# Patient Record
Sex: Female | Born: 1950 | Race: White | Hispanic: No | Marital: Married | Smoking: Never smoker
Health system: Southern US, Community
[De-identification: ages and names within clinical notes are randomized; demographics above are authoritative.]

## PROBLEM LIST (undated history)

## (undated) DIAGNOSIS — R112 Nausea with vomiting, unspecified: Secondary | ICD-10-CM

## (undated) DIAGNOSIS — M79645 Pain in left finger(s): Secondary | ICD-10-CM

## (undated) DIAGNOSIS — Z9889 Other specified postprocedural states: Secondary | ICD-10-CM

## (undated) DIAGNOSIS — T4145XA Adverse effect of unspecified anesthetic, initial encounter: Secondary | ICD-10-CM

## (undated) DIAGNOSIS — M810 Age-related osteoporosis without current pathological fracture: Secondary | ICD-10-CM

## (undated) DIAGNOSIS — T8859XA Other complications of anesthesia, initial encounter: Secondary | ICD-10-CM

## (undated) DIAGNOSIS — M1990 Unspecified osteoarthritis, unspecified site: Secondary | ICD-10-CM

## (undated) HISTORY — PX: ABDOMINAL HYSTERECTOMY: SHX81

## (undated) HISTORY — PX: OOPHORECTOMY: SHX86

---

## 2015-10-18 ENCOUNTER — Emergency Department (HOSPITAL_BASED_OUTPATIENT_CLINIC_OR_DEPARTMENT_OTHER)
Admission: EM | Admit: 2015-10-18 | Discharge: 2015-10-18 | Disposition: A | Discharge status: Home / Self Care | Payer: Medicare Other | Attending: Emergency Medicine | Admitting: Emergency Medicine

## 2015-10-18 ENCOUNTER — Emergency Department (HOSPITAL_BASED_OUTPATIENT_CLINIC_OR_DEPARTMENT_OTHER): Payer: Medicare Other

## 2015-10-18 ENCOUNTER — Encounter (HOSPITAL_BASED_OUTPATIENT_CLINIC_OR_DEPARTMENT_OTHER): Payer: Self-pay | Admitting: *Deleted

## 2015-10-18 DIAGNOSIS — Y999 Unspecified external cause status: Secondary | ICD-10-CM | POA: Insufficient documentation

## 2015-10-18 DIAGNOSIS — Y939 Activity, unspecified: Secondary | ICD-10-CM | POA: Insufficient documentation

## 2015-10-18 DIAGNOSIS — Z79899 Other long term (current) drug therapy: Secondary | ICD-10-CM | POA: Insufficient documentation

## 2015-10-18 DIAGNOSIS — S52592A Other fractures of lower end of left radius, initial encounter for closed fracture: Secondary | ICD-10-CM | POA: Insufficient documentation

## 2015-10-18 DIAGNOSIS — W07XXXA Fall from chair, initial encounter: Secondary | ICD-10-CM | POA: Insufficient documentation

## 2015-10-18 DIAGNOSIS — Y929 Unspecified place or not applicable: Secondary | ICD-10-CM | POA: Insufficient documentation

## 2015-10-18 DIAGNOSIS — S5292XA Unspecified fracture of left forearm, initial encounter for closed fracture: Secondary | ICD-10-CM

## 2015-10-18 HISTORY — DX: Age-related osteoporosis without current pathological fracture: M81.0

## 2015-10-18 NOTE — ED Notes (Signed)
Patient transported to X-ray 

## 2015-10-18 NOTE — ED Provider Notes (Signed)
[facility] Provider Note   CSN: [id number] Arrival date & time: 10/18/15  1511     History   Chief Complaint Chief Complaint  Patient presents with  . Fall    HPI Carolyn [name] is a 65 y.o. female.  HPI  65 year old female with a history of osteoporosis who fell from a chair and injured her left wrist. This occurred just prior to arrival. Patient states that she was yanking on something and due to momentum fell off the back of the chair. She landed on her wrist but is not exactly sure how. She has had pain and swelling in the wrist and at the base of her hand. No weakness or numbness. She thinks she hit her head on another chair on the way down but did not lose consciousness. Denies a headache currently. No nausea or vomiting or blurry vision. No other injuries.Is not on a blood thinner.  Past Medical History:  Diagnosis Date  . Osteoporosis     There are no active problems to display for this patient.   Past Surgical History:  Procedure Laterality Date  . ABDOMINAL HYSTERECTOMY    . CESAREAN SECTION    . OOPHORECTOMY      OB History    No data available       Home Medications    Prior to Admission medications   Medication Sig Start Date End Date Taking? Authorizing Provider  Calcium Carb-Cholecalciferol (CALCIUM 1000 + D PO) Take by mouth.   Yes Historical Provider, MD  estrogens, conjugated, (PREMARIN) 0.3 MG tablet Take 0.3 mg by mouth daily. Take daily for 21 days then do not take for 7 days.   Yes Historical Provider, MD  ketotifen (ZADITOR) 0.025 % ophthalmic solution 1 drop 2 (two) times daily.   Yes Historical Provider, MD    Family History History reviewed. No pertinent family history.  Social History Social History  Substance Use Topics  . Smoking status: Never Smoker  . Smokeless tobacco: Never Used  . Alcohol use 6.0 oz/week    10 Glasses of wine per week     Allergies   Sulfa antibiotics   Review of Systems Review of  Systems  Eyes: Negative for visual disturbance.  Gastrointestinal: Negative for nausea and vomiting.  Musculoskeletal: Positive for arthralgias and joint swelling.  Neurological: Negative for weakness, numbness and headaches.  All other systems reviewed and are negative.    Physical Exam Updated Vital Signs BP 128/74 (BP Location: Left Arm)   Pulse 88   Temp 98.6 F (37 C) (Oral)   Resp 18   Ht 5' 4.5" (1.638 m)   Wt 130 lb (59 kg)   SpO2 98%   BMI 21.97 kg/m   Physical Exam  Constitutional: She is oriented to person, place, and time. She appears well-developed and well-nourished. No distress.  HENT:  Head: Normocephalic and atraumatic.  Right Ear: External ear normal.  Left Ear: External ear normal.  Nose: Nose normal.  No swelling or tenderness to scalp  Eyes: EOM are normal. Pupils are equal, round, and reactive to light. Right eye exhibits no discharge. Left eye exhibits no discharge.  Neck: Normal range of motion. Neck supple.  No neck tenderness  Cardiovascular: Normal rate, regular rhythm and normal heart sounds.   Pulses:      Radial pulses are 2+ on the left side.  Pulmonary/Chest: Effort normal and breath sounds normal.  Abdominal: She exhibits no distension.  Musculoskeletal: She exhibits no deformity.  Left elbow: She exhibits normal range of motion. No tenderness found.       Left wrist: She exhibits decreased range of motion, tenderness and swelling (mild).       Left forearm: She exhibits no tenderness.       Left hand: She exhibits tenderness (over left scaphoid). She exhibits normal capillary refill and no deformity. Normal sensation noted.  Neurological: She is alert and oriented to person, place, and time.  Skin: Skin is warm and dry. She is not diaphoretic.  Nursing note and vitals reviewed.    ED Treatments / Results  Labs (all labs ordered are listed, but only abnormal results are displayed) Labs Reviewed - No data to display  EKG  EKG  Interpretation None       Radiology Dg Wrist Complete Left  Result Date: 10/18/2015 CLINICAL DATA:  [place] with left wrist pain EXAM: LEFT WRIST - COMPLETE 3+ VIEW COMPARISON:  None. FINDINGS: There does appear to be a subtle linear lucency within the distal left radius extending to the radiocarpal joint space consistent with nondisplaced fracture. This linear lucency is only seen on one view. The distal left ulna is intact. Carpal bones are in normal position. IMPRESSION: There does appear to be a subtle linear fracture of the distal left radius involving the radiocarpal joint space on one view. Electronically Signed   By: Ivar Drape M.D.   On: 10/18/2015 15:44    Procedures Procedures (including critical care time)  Medications Ordered in ED Medications - No data to display   Initial Impression / Assessment and Plan / ED Course  I have reviewed the triage vital signs and the nursing notes.  Pertinent labs & imaging results that were available during my care of the patient were reviewed by me and considered in my medical decision making (see chart for details).  Clinical Course    Nondisplaced fracture of left radius as above. Placed in a thumb spica given the significant scaphoid tenderness. Follow-up with with orthopedics. She does not want anything stronger than tylenol for pain. No headache or concern for serious intracranial injury. Discussed return precautions.  Final Clinical Impressions(s) / ED Diagnoses   Final diagnoses:  Radius fracture, left, closed, initial encounter    New Prescriptions New Prescriptions   No medications on file     Sherwood Gambler, MD 10/18/15 [phone]

## 2015-10-18 NOTE — ED Notes (Signed)
MD at bedside. 

## 2015-10-18 NOTE — ED Triage Notes (Addendum)
Fall from standing in a chair on a patio.  Reports left wrist pain.  Denies LOC but did hit head.  Slight swelling to left wrist. CMS intact.

## 2016-01-02 ENCOUNTER — Ambulatory Visit: Payer: Medicare Other | Attending: Orthopedic Surgery | Admitting: Physical Therapy

## 2016-01-02 DIAGNOSIS — R293 Abnormal posture: Secondary | ICD-10-CM | POA: Insufficient documentation

## 2016-01-02 DIAGNOSIS — M25512 Pain in left shoulder: Secondary | ICD-10-CM | POA: Diagnosis present

## 2016-01-02 DIAGNOSIS — M25612 Stiffness of left shoulder, not elsewhere classified: Secondary | ICD-10-CM

## 2016-01-02 NOTE — Therapy (Addendum)
[facility] [address]  [name], [place], [address] Phone: [phone]   Fax:  [phone]  Physical Therapy Evaluation  Patient Details  Name: [name] MRN: [id number] Date of Birth: [birth year] Referring Provider: Jolyn Nap, MD  Encounter Date: 01/02/2016      PT End of Session - 01/02/16 0853    Visit Number 1   Number of Visits 8   Date for PT Re-Evaluation 01/30/16   PT Start Time 0840   PT Stop Time 0934   PT Time Calculation (min) 54 min   Activity Tolerance Patient tolerated treatment well   Behavior During Therapy [facility] for tasks assessed/performed      Past Medical History:  Diagnosis Date  . Osteoporosis     Past Surgical History:  Procedure Laterality Date  . ABDOMINAL HYSTERECTOMY    . CESAREAN SECTION    . OOPHORECTOMY      There were no vitals filed for this visit.       Subjective Assessment - 01/02/16 0843    Subjective Pt fell on 10/09/15 resulting in L radial wrist fracture. Feels like wrist is recovering well other than tendon rupture which will require surgery. Pt reports more issues with shoulder at present, including constant nagging type pain with limited functional ROM esp in IR. Reports pain will wake her up from sleeping.   Patient Stated Goals "To get my shoulder back to normal."   Currently in Pain? No/denies   Pain Score 0-No pain  Least 0/10, Up to 4/10 at worst   Pain Location Shoulder   Pain Orientation Left;Lateral;Posterior;Upper   Pain Descriptors / Indicators Aching   Pain Type Acute pain   Pain Radiating Towards down posterior/lateral upper arm to elbow   Pain Onset More than a month ago  at time of wrist fracture   Pain Frequency Intermittent   Aggravating Factors  laying on her arm, reaching behind her back, lifting, raising arm overhead   Pain Relieving Factors rest, OTC pain meds   Effect of Pain on Daily Activities requires her to move  more slowly, unable to sleep on L side (preferred sleeping position)            [facility] PT Assessment - 01/02/16 0845      Assessment   Medical Diagnosis L shoulder pain, L wrist distal radius fracture   Referring Provider Jolyn Nap, MD   Onset Date/Surgical Date 10/09/15   Hand Dominance Right   Next MD Visit week of 02/07/16 - surgery for tendon repair   Prior Therapy none     Balance Screen   Has the patient fallen in the past 6 months Yes   How many times? 1  fell off chair   Has the patient had a decrease in activity level because of a fear of falling?  No   Is the patient reluctant to leave their home because of a fear of falling?  No     Prior Function   Level of Independence Independent   Vocation Retired   Surveyor, minerals, gym, walk, yoga     Observation/Other Assessments   Focus on Therapeutic Outcomes (FOTO)  Wrist 55% (45% limitation); predicted 70% (30% limitation)     Posture/Postural Control   Posture/Postural Control Postural limitations   Postural Limitations Forward head;Rounded Shoulders;Decreased thoracic kyphosis     ROM / Strength   AROM / PROM / Strength AROM;Strength     AROM  AROM Assessment Site Shoulder;Elbow;Forearm;Wrist   Right/Left Shoulder Right;Left   Right Shoulder Flexion 157 Degrees   Right Shoulder ABduction 180 Degrees   Right Shoulder Internal Rotation 100 Degrees   Right Shoulder External Rotation 96 Degrees   Left Shoulder Flexion 151 Degrees   Left Shoulder ABduction 170 Degrees   Left Shoulder Internal Rotation 89 Degrees   Left Shoulder External Rotation 85 Degrees   Right/Left Elbow Right;Left  WNL   Right/Left Forearm Right;Left   Right Forearm Pronation 95 Degrees   Right Forearm Supination 102 Degrees   Left Forearm Pronation 95 Degrees   Left Forearm Supination 89 Degrees     Strength   Overall Strength Comments pain with resistance to L shoulder   Strength Assessment Site Shoulder;Elbow;Forearm;Wrist;Hand    Right/Left Shoulder Right;Left   Right Shoulder Flexion 5/5   Right Shoulder ABduction 5/5   Right Shoulder Internal Rotation 5/5   Right Shoulder External Rotation 5/5   Left Shoulder Flexion 4/5   Left Shoulder ABduction 4/5   Left Shoulder Internal Rotation 4/5   Left Shoulder External Rotation 4/5   Right/Left Elbow Right;Left   Right Elbow Flexion 5/5   Right Elbow Extension 4+/5   Left Elbow Flexion 4+/5   Left Elbow Extension 4/5   Right/Left Forearm Right;Left   Right Forearm Pronation 5/5   Right Forearm Supination 5/5   Left Forearm Pronation 4+/5   Left Forearm Supination 4+/5   Right/Left Wrist Right;Left   Right Wrist Flexion 5/5   Right Wrist Extension 5/5   Right Wrist Radial Deviation 5/5   Right Wrist Ulnar Deviation 5/5   Left Wrist Flexion 4/5   Left Wrist Extension 4/5   Left Wrist Radial Deviation 4/5   Left Wrist Ulnar Deviation 4/5   Right/Left hand Right;Left   Right Hand Grip (lbs) 31   Left Hand Grip (lbs) <5     Palpation   Palpation comment increased muscle tension in B UT, RTC and pecs - L>R     Special Tests    Special Tests Rotator Cuff Impingement   Rotator Cuff Impingment tests Michel Bickers test     Hawkins-Kennedy test   Findings Positive   Side Left                   [facility] Adult PT Treatment/Exercise - 01/02/16 0845      Exercises   Exercises Shoulder     Neck Exercises: Seated   Neck Retraction 10 reps;5 secs     Shoulder Exercises: Seated   Retraction Both;10 reps  5" hold     Shoulder Exercises: Standing   Row Both;10 reps;Theraband   Theraband Level (Shoulder Row) Level 3 (Green)   Retraction Both;10 reps;Theraband   Theraband Level (Shoulder Retraction) Level 3 (Green)     Shoulder Exercises: IT sales professional 30 seconds   Corner Stretch Limitations 3 way doorway stretch (low/mid/high)                PT Education - 01/02/16 0930    Education provided Yes   Education Details  PT eval findings, POC & initial HEP   Person(s) Educated Patient   Methods Explanation;Demonstration;Handout   Comprehension Verbalized understanding;Returned demonstration;Need further instruction             PT Long Term Goals - 01/02/16 0945      PT LONG TERM GOAL #1   Title Independent with HEP by 01/30/16   Status New  PT LONG TERM GOAL #2   Title L shoudler ROM equivalent to R w/o pain by 01/30/16   Status New     PT LONG TERM GOAL #3   Title L shoulder MMT >/= 4+/5 w/o pain by 01/30/16   Status New     PT LONG TERM GOAL #4   Title Pt will routinely demonstrate neutral spine and shoulder posture w/o cues at least 75% of time by 01/30/16   Status New     PT LONG TERM GOAL #5   Title Pt will report 75% or better improvement in use of L UE with normal ADL's and functional tasks w/o limitation due to shoulder pain by 01/30/16   Status New               Plan - 01/02/16 0942    Clinical Impression Statement Carolyn [name] is a 65 y/o female who presents to OP PT for a low complexity eval for L shoulder pain following recent fall with L wrist distal radius fracture. Pt noting good recovery from wrist fracture other than tendon rupture than will require surgery in January. Given pending surgery pt would like to focus current POC on resolving shoulder pain and restoring normal functional use of L shoulder. Pt reports shoulder pain is intermittent, denying pain at time of eval but reporting pain up to [id number] with certain movements and activities, esp laying on her L side, reaching behind her back or raising arm overhead, and lifting objects. Assessment revealed only minor restrictions in L shoulder and UE ROM relative to R but required slow movement patterns to achieve this ROM due to pain (refer to above ROM assessment). L shoulder MMT 4/5 overall but pain noted when resistance applied in all directions. Remainder of L UE MMT 4 to 4+/5 with no pain reported, but significantly impaired grip  strength present on L vs R. Postural assessment reveals forward head and shoulders with increased muscle tension in pecs, UT and RTC muscles, L>R. POC will focus on improving postural awareness including increasing muscle flexibility along with soft tissue pliability, scapular strengthening/stabilization, L shoulder ROM and strengthening, with manual therapy and modalities PRN for pain. Pt may benefit from trial of TENS for pain control as indicated.   Rehab Potential Good   PT Frequency 2x / week   PT Duration 4 weeks   PT Treatment/Interventions Patient/family education;Neuromuscular re-education;Therapeutic exercise;Manual techniques;Taping;Dry needling;Therapeutic activities;Electrical Stimulation;Moist Heat;Cryotherapy;ADLs/Self Care Home Management   PT Next Visit Plan Postural training; Scapular stabilization & strengthening; Manual therapy & modalities as indicated   Consulted and Agree with Plan of Care Patient      Patient will benefit from skilled therapeutic intervention in order to improve the following deficits and impairments:  Pain, Postural dysfunction, Impaired flexibility, Decreased range of motion, Decreased strength, Increased muscle spasms, Decreased activity tolerance, Impaired UE functional use  Visit Diagnosis: Acute pain of left shoulder - Plan: PT plan of care cert/re-cert  Stiffness of left shoulder, not elsewhere classified - Plan: PT plan of care cert/re-cert  Abnormal posture - Plan: PT plan of care cert/re-cert      G-Codes - [id number]    Functional Assessment Tool Used FOTO = 55% (45% impaired) + clinical judgement   Functional Limitation Carrying, moving and handling objects   Carrying, Moving and Handling Objects Current Status [id number]) At least 40 percent but less than 60 percent impaired, limited or restricted   Carrying, Moving and Handling Objects Goal Status [id number]) At least 20 percent but  less than 40 percent impaired, limited or restricted        Problem List There are no active problems to display for this patient.   Percival Spanish, PT, MPT 01/02/2016, 11:19 AM  [facility] [address]  [place], [place], [address] Phone: [phone]   Fax:  [phone]  Name: Maretta Ferroni MRN: [id number] Date of Birth: 02/07/1950

## 2016-01-05 ENCOUNTER — Ambulatory Visit: Payer: Medicare Other | Admitting: Physical Therapy

## 2016-01-05 DIAGNOSIS — M25512 Pain in left shoulder: Secondary | ICD-10-CM

## 2016-01-05 DIAGNOSIS — R293 Abnormal posture: Secondary | ICD-10-CM

## 2016-01-05 DIAGNOSIS — M25612 Stiffness of left shoulder, not elsewhere classified: Secondary | ICD-10-CM

## 2016-01-05 NOTE — Therapy (Signed)
[facility] [address]  [place], [place], [address] Phone: [phone]   Fax:  [phone]  Physical Therapy Treatment  Patient Details  Name: Carolyn [name] MRN: [id number] Date of Birth: [birth year] Referring Provider: Jolyn Nap, MD  Encounter Date: 01/05/2016      PT End of Session - 01/05/16 1056    Visit Number 2   Number of Visits 8   Date for PT Re-Evaluation 01/30/16   Authorization Type Medicare   PT Start Time 1056   PT Stop Time 1143   PT Time Calculation (min) 47 min   Activity Tolerance Patient tolerated treatment well   Behavior During Therapy [facility] for tasks assessed/performed      Past Medical History:  Diagnosis Date  . Osteoporosis     Past Surgical History:  Procedure Laterality Date  . ABDOMINAL HYSTERECTOMY    . CESAREAN SECTION    . OOPHORECTOMY      There were no vitals filed for this visit.      Subjective Assessment - 01/05/16 1100    Subjective Pt noting some tenderness in the L scapular region which she thinks may be from some of the HEP stretches.   Patient Stated Goals "To get my shoulder back to normal."   Currently in Pain? Yes  "not really pain, just tight/tender"   Pain Score 2    Pain Location Scapula   Pain Orientation Left   Pain Descriptors / Indicators Tender;Tightness   Pain Onset --  at time of wrist fracture                         [facility] Adult PT Treatment/Exercise - 01/05/16 1056      Neck Exercises: Seated   Neck Retraction 10 reps;5 secs     Shoulder Exercises: Supine   Horizontal ABduction Both;Theraband;15 reps   Theraband Level (Shoulder Horizontal ABduction) Level 3 (Green)   Horizontal ABduction Limitations hooklying on 1/2 FR, TB looped at end for L wrist   External Rotation Both;5 reps  stopped due to pain   Theraband Level (Shoulder External Rotation) Level 1 (Yellow)   External Rotation Limitations hooklying  on 1/2 FR, TB looped at end for L wrist     Shoulder Exercises: Seated   Retraction Both;10 reps  5" hold     Shoulder Exercises: Sidelying   External Rotation Left;10 reps   External Rotation Limitations VC & TC for scapular retraction   ABduction Left;10 reps   ABduction Limitations PT guiding scapular motion     Shoulder Exercises: Standing   Row Both;10 reps;Theraband   Theraband Level (Shoulder Row) Level 3 (Green)   Row Limitations TB looped at end for L wrist   Retraction Both;10 reps;Theraband   Theraband Level (Shoulder Retraction) Level 3 (Green)   Retraction Limitations TB looped at end for L wrist     Shoulder Exercises: ROM/Strengthening   UBE (Upper Arm Bike) lvl 1.0 fwd/back x 2' each     Shoulder Exercises: IT sales professional 30 seconds   Corner Stretch Limitations 3 way doorway stretch (low/mid/high) - high position increased pain, therefore deferred from HEP at present   Other Shoulder Stretches Hooklying pec stretch over 1/2 FR x2'     Manual Therapy   Manual Therapy Soft tissue mobilization   Soft tissue mobilization L pecs, supraspinatus, teres major/minor & subscapularis  PT Long Term Goals - 01/05/16 1105      PT LONG TERM GOAL #1   Title Independent with HEP by 01/30/16   Status On-going     PT LONG TERM GOAL #2   Title L shoudler ROM equivalent to R w/o pain by 01/30/16   Status On-going     PT LONG TERM GOAL #3   Title L shoulder MMT >/= 4+/5 w/o pain by 01/30/16   Status On-going     PT LONG TERM GOAL #4   Title Pt will routinely demonstrate neutral spine and shoulder posture w/o cues at least 75% of time by 01/30/16   Status On-going     PT LONG TERM GOAL #5   Title Pt will report 75% or better improvement in use of L UE with normal ADL's and functional tasks w/o limitation due to shoulder pain by 01/30/16   Status On-going               Plan - 01/05/16 1105    Clinical Impression Statement Pt  reporting some improvement with HEP but requesting clarification of some exercises. HEP reviewed with all exercises performed correctly, but increased pain noted with overhead doorway stretch therefore deferred from HEP. Also modified theraband to create a loop for L wrist d/t pt c/o wrist discomfort when attempting to grip theraband. Limited tolerance for ER & sidelying ABD but improved with PT guiding scapular movement. STM to periscapular and inferior/porsterior capsule muscles with decreased pain reported with motion after this.   Rehab Potential Good   PT Treatment/Interventions Patient/family education;Neuromuscular re-education;Therapeutic exercise;Manual techniques;Taping;Dry needling;Therapeutic activities;Electrical Stimulation;Moist Heat;Cryotherapy;ADLs/Self Care Home Management   PT Next Visit Plan Postural training; Scapular stabilization & strengthening; Manual therapy & modalities as indicated      Patient will benefit from skilled therapeutic intervention in order to improve the following deficits and impairments:  Pain, Postural dysfunction, Impaired flexibility, Decreased range of motion, Decreased strength, Increased muscle spasms, Decreased activity tolerance, Impaired UE functional use  Visit Diagnosis: Acute pain of left shoulder  Stiffness of left shoulder, not elsewhere classified  Abnormal posture     Problem List There are no active problems to display for this patient.   Percival Spanish, PT, MPT 01/05/2016, 12:09 PM  [facility] [address]  [place], [place], [address] Phone: [phone]   Fax:  [phone]  Name: Carolyn [name] MRN: [id number] Date of Birth: 19-Apr-1950

## 2016-01-09 ENCOUNTER — Ambulatory Visit: Payer: Medicare Other | Admitting: Physical Therapy

## 2016-01-09 DIAGNOSIS — M25512 Pain in left shoulder: Secondary | ICD-10-CM

## 2016-01-09 DIAGNOSIS — M25612 Stiffness of left shoulder, not elsewhere classified: Secondary | ICD-10-CM

## 2016-01-09 DIAGNOSIS — R293 Abnormal posture: Secondary | ICD-10-CM

## 2016-01-09 NOTE — Therapy (Signed)
[facility] [address]  [place], [place], [address] Phone: [phone]   Fax:  [phone]  Physical Therapy Treatment  Patient Details  Name: Carolyn [name] MRN: [id number] Date of Birth: 12-25-1950 Referring Provider: Jolyn Nap, MD  Encounter Date: 01/09/2016      PT End of Session - 01/09/16 0850    Visit Number 3   Number of Visits 8   Date for PT Re-Evaluation 01/30/16   Authorization Type Medicare   PT Start Time 0846   PT Stop Time 0932   PT Time Calculation (min) 46 min   Activity Tolerance Patient tolerated treatment well   Behavior During Therapy [name] for tasks assessed/performed      Past Medical History:  Diagnosis Date  . Osteoporosis     Past Surgical History:  Procedure Laterality Date  . ABDOMINAL HYSTERECTOMY    . CESAREAN SECTION    . OOPHORECTOMY      There were no vitals filed for this visit.      Subjective Assessment - 01/09/16 0848    Subjective Patient reporitng some soreness in L forearm region - drove to [place] over the wekeend and gave friend back rub - doesn't know if this flared her up or not.    Patient Stated Goals "To get my shoulder back to normal."   Currently in Pain? Yes   Pain Score 2    Pain Location Scapula   Pain Orientation Left   Pain Descriptors / Indicators Tightness;Tender   Pain Type Acute pain   Pain Frequency Intermittent   Aggravating Factors  laying on her arm, reaching behind her back, lifting, rasing arm overhead   Pain Relieving Factors rest, OTC pain meds                         [facility] Adult PT Treatment/Exercise - 01/09/16 0851      Shoulder Exercises: Supine   Horizontal ABduction Both;Theraband;15 reps   Theraband Level (Shoulder Horizontal ABduction) Level 3 (Green)   Horizontal ABduction Limitations hooklying on 1/2 FR, TB looped at end for L wrist   External Rotation Strengthening;Both;15 reps;Theraband    Theraband Level (Shoulder External Rotation) Level 2 (Red)   External Rotation Limitations hooklying on 1/2 FR, TB looped at end for L wrist     Shoulder Exercises: Sidelying   External Rotation Strengthening;Left;15 reps;Weights   External Rotation Weight (lbs) 1   External Rotation Limitations VC/TC for scap retraction   ABduction Strengthening;Left;15 reps;Weights   ABduction Weight (lbs) 1   ABduction Limitations PT guiding scapular motion     Shoulder Exercises: Standing   External Rotation Strengthening;Left;Theraband   Theraband Level (Shoulder External Rotation) Level 2 (Red)   Internal Rotation Strengthening;Left;12 reps;Theraband   Theraband Level (Shoulder Internal Rotation) Level 2 (Red)   Row Strengthening;Both;15 reps;Theraband   Theraband Level (Shoulder Row) Level 3 (Green)   Row Limitations 5 second hold     Shoulder Exercises: ROM/Strengthening   UBE (Upper Arm Bike) level 1.5 x 6 minutes (3' fwd/ 3' bwd)     Shoulder Exercises: Stretch   Corner Stretch Limitations HEP review   Other Shoulder Stretches Hooklying pec stretch over 1/2 FR x2'  1' arms bent; 1' arms straight   Other Shoulder Stretches IR towel stretch - 3 x 30 seconds     Manual Therapy   Manual Therapy Soft tissue mobilization   Soft tissue mobilization L pec  in lengthened position; L teres major/minor, subscapularis, and infraspinatus in sidelying                     PT Long Term Goals - 01/05/16 1105      PT LONG TERM GOAL #1   Title Independent with HEP by 01/30/16   Status On-going     PT LONG TERM GOAL #2   Title L shoudler ROM equivalent to R w/o pain by 01/30/16   Status On-going     PT LONG TERM GOAL #3   Title L shoulder MMT >/= 4+/5 w/o pain by 01/30/16   Status On-going     PT LONG TERM GOAL #4   Title Pt will routinely demonstrate neutral spine and shoulder posture w/o cues at least 75% of time by 01/30/16   Status On-going     PT LONG TERM GOAL #5   Title Pt  will report 75% or better improvement in use of L UE with normal ADL's and functional tasks w/o limitation due to shoulder pain by 01/30/16   Status On-going               Plan - 01/09/16 0850    Clinical Impression Statement Patient today with continued palpable muscle tightness throughout L pec, proximal L bicep as well as L teres minor/major, and L infraspinatus with patient tender to deep palpation. Patient today with some increased pain/soreness from over the weekend activities, however able to tolerate all RTC and periscpaular strengthening with no increase in pain. Standing ER/IR with theraband initiated today with patient requiring VC and TC to prevent compensations at the shoulder. Patient with some improved scapular motion during sidelying abduction. Patient to continue to benefit form PT to maximize functional use of L UE.    PT Treatment/Interventions Patient/family education;Neuromuscular re-education;Therapeutic exercise;Manual techniques;Taping;Dry needling;Therapeutic activities;Electrical Stimulation;Moist Heat;Cryotherapy;ADLs/Self Care Home Management   PT Next Visit Plan Postural training; Scapular stabilization & strengthening; Manual therapy & modalities as indicated   Consulted and Agree with Plan of Care Patient      Patient will benefit from skilled therapeutic intervention in order to improve the following deficits and impairments:  Pain, Postural dysfunction, Impaired flexibility, Decreased range of motion, Decreased strength, Increased muscle spasms, Decreased activity tolerance, Impaired UE functional use  Visit Diagnosis: Acute pain of left shoulder  Stiffness of left shoulder, not elsewhere classified  Abnormal posture     Problem List There are no active problems to display for this patient.     Carolyn [name], PT, DPT 01/09/16 10:28 AM    Carolyn [name] Carolyn [name] [place], [place], [address] Phone: [phone]   Fax:  [phone]  Name: Carolyn [name] MRN: [id number] Date of Birth: 05/21/1950

## 2016-01-12 ENCOUNTER — Ambulatory Visit: Payer: Medicare Other

## 2016-01-12 DIAGNOSIS — M25612 Stiffness of left shoulder, not elsewhere classified: Secondary | ICD-10-CM

## 2016-01-12 DIAGNOSIS — M25512 Pain in left shoulder: Secondary | ICD-10-CM

## 2016-01-12 DIAGNOSIS — R293 Abnormal posture: Secondary | ICD-10-CM

## 2016-01-12 NOTE — Therapy (Signed)
[facility] [address]  [place], [place], [address] Phone: [phone]   Fax:  [phone]  Physical Therapy Treatment  Patient Details  Name: Kathren Char MRN: [id number] Date of Birth: April 07, 1950 Referring Provider: Jolyn Nap, MD  Encounter Date: 01/12/2016      PT End of Session - 01/12/16 0812    Visit Number 4   Number of Visits 8   Date for PT Re-Evaluation 01/30/16   Authorization Type Medicare   PT Start Time 0804   PT Stop Time 0845   PT Time Calculation (min) 41 min   Activity Tolerance Patient tolerated treatment well   Behavior During Therapy [facility] for tasks assessed/performed      Past Medical History:  Diagnosis Date  . Osteoporosis     Past Surgical History:  Procedure Laterality Date  . ABDOMINAL HYSTERECTOMY    . CESAREAN SECTION    . OOPHORECTOMY      There were no vitals filed for this visit.      Subjective Assessment - 01/12/16 0807    Subjective Pt. reporting she is not sore following treatment at this point however did have soreness this morning after doing IR stretch with towel.     Patient Stated Goals "To get my shoulder back to normal."   Currently in Pain? No/denies   Pain Score 0-No pain   Multiple Pain Sites No             [facility] Adult PT Treatment/Exercise - 01/12/16 0815      Shoulder Exercises: Supine   Horizontal ABduction Both;Theraband;20 reps   Theraband Level (Shoulder Horizontal ABduction) Level 3 (Green)   Horizontal ABduction Limitations hooklying on 1/2 FR, TB looped at end for L wrist   External Rotation Strengthening;Both;Theraband;20 reps   Theraband Level (Shoulder External Rotation) Level 2 (Red)   External Rotation Limitations hooklying on 1/2 FR, TB looped at end for L wrist     Shoulder Exercises: Standing   External Rotation Strengthening;Left;Theraband;20 reps  with towel; 2 sets    Theraband Level (Shoulder External  Rotation) Level 2 (Red)   Internal Rotation Strengthening;Left;Theraband;15 reps  towel by side; 2 sets    Theraband Level (Shoulder Internal Rotation) Level 2 (Red)   Extension Strengthening;Both;15 reps;20 reps   Theraband Level (Shoulder Extension) Level 3 (Green)   Row Strengthening;Both;15 reps;Theraband   Theraband Level (Shoulder Row) Level 4 (Blue)   Row Limitations 5 second hold     Shoulder Exercises: ROM/Strengthening   UBE (Upper Arm Bike) level 1.5 x 6 minutes (3' fwd/ 3' bwd)              PT Long Term Goals - 01/05/16 1105      PT LONG TERM GOAL #1   Title Independent with HEP by 01/30/16   Status On-going     PT LONG TERM GOAL #2   Title L shoudler ROM equivalent to R w/o pain by 01/30/16   Status On-going     PT LONG TERM GOAL #3   Title L shoulder MMT >/= 4+/5 w/o pain by 01/30/16   Status On-going     PT LONG TERM GOAL #4   Title Pt will routinely demonstrate neutral spine and shoulder posture w/o cues at least 75% of time by 01/30/16   Status On-going     PT LONG TERM GOAL #5   Title Pt will report 75% or better improvement in use of L UE  with normal ADL's and functional tasks w/o limitation due to shoulder pain by 01/30/16   Status On-going               Plan - 01/12/16 [id number]    Clinical Impression Statement Pt. able to progress to blue TB with rows and perform increased repetitions with majority of therex today without increased pain in L shoulder.  Only pain today was with end range L shoulder ER with resistance, which quickly resolved.  Pt. will return to therapy on 12.26.  Will plan to continues RTC/scapular strengthening and gentle anterior chest stretching per pt. tolerance in coming visits.      PT Treatment/Interventions Patient/family education;Neuromuscular re-education;Therapeutic exercise;Manual techniques;Taping;Dry needling;Therapeutic activities;Electrical Stimulation;Moist Heat;Cryotherapy;ADLs/Self Care Home Management   PT Next Visit  Plan Postural training; Scapular stabilization & strengthening; Manual therapy & modalities as indicated      Patient will benefit from skilled therapeutic intervention in order to improve the following deficits and impairments:  Pain, Postural dysfunction, Impaired flexibility, Decreased range of motion, Decreased strength, Increased muscle spasms, Decreased activity tolerance, Impaired UE functional use  Visit Diagnosis: Acute pain of left shoulder  Stiffness of left shoulder, not elsewhere classified  Abnormal posture     Problem List There are no active problems to display for this patient.   Bess Harvest, PTA 01/12/16 10:50 AM  [facility] [address]  [place], [place], [address] Phone: [phone]   Fax:  [phone]  Name: Lyndsey Lichtman MRN: [id number] Date of Birth: Sep 08, 1950

## 2016-01-17 ENCOUNTER — Ambulatory Visit: Payer: Medicare Other

## 2016-01-17 DIAGNOSIS — M25512 Pain in left shoulder: Secondary | ICD-10-CM | POA: Diagnosis not present

## 2016-01-17 DIAGNOSIS — R293 Abnormal posture: Secondary | ICD-10-CM

## 2016-01-17 DIAGNOSIS — M25612 Stiffness of left shoulder, not elsewhere classified: Secondary | ICD-10-CM

## 2016-01-17 NOTE — Therapy (Signed)
[facility] [address]  [place], [place], [address] Phone: [phone]   Fax:  [phone]  Physical Therapy Treatment  Patient Details  Name: Carolyn [name] MRN: [id number] Date of Birth: December 22, 1950 Referring Provider: Jolyn Nap, MD  Encounter Date: 01/17/2016      PT End of Session - 01/17/16 1321    Visit Number 5   Number of Visits 8   Date for PT Re-Evaluation 01/30/16   Authorization Type Medicare   PT Start Time 1315   PT Stop Time 1355   PT Time Calculation (min) 40 min   Activity Tolerance Patient tolerated treatment well   Behavior During Therapy [facility] for tasks assessed/performed      Past Medical History:  Diagnosis Date  . Osteoporosis     Past Surgical History:  Procedure Laterality Date  . ABDOMINAL HYSTERECTOMY    . CESAREAN SECTION    . OOPHORECTOMY      There were no vitals filed for this visit.      Subjective Assessment - 01/17/16 1316    Subjective Pt. reporting L shoulder and L wrist have been hurting after lowering down to the floor playing, "duck, duck, goose".  Pt. reporting she was able to, "scrub the floor", without shoulder pain over the holiday weekend.   Patient Stated Goals "To get my shoulder back to normal."   Currently in Pain? No/denies   Pain Score 0-No pain   Multiple Pain Sites No            [facility] Adult PT Treatment/Exercise - 01/17/16 1325      Shoulder Exercises: Standing   External Rotation Strengthening;Left;Theraband;20 reps  2 sets; towel by side    Theraband Level (Shoulder External Rotation) Level 2 (Red)   Internal Rotation Strengthening;Left;Theraband;15 reps  2 sets; towel pinch by side    Theraband Level (Shoulder Internal Rotation) Level 2 (Red)   Row Strengthening;Both;15 reps;Theraband   Theraband Level (Shoulder Row) Level 4 (Blue)   Row Limitations 5 second hold     Manual Therapy   Manual Therapy Soft tissue  mobilization;Passive ROM   Soft tissue mobilization L pec stretch in lengthened position    Passive ROM all directions with static stretch x 30 sec all directions  pt. very guarded today with pain with ER/flexion           PT Long Term Goals - 01/05/16 1105      PT LONG TERM GOAL #1   Title Independent with HEP by 01/30/16   Status On-going     PT LONG TERM GOAL #2   Title L shoudler ROM equivalent to R w/o pain by 01/30/16   Status On-going     PT LONG TERM GOAL #3   Title L shoulder MMT >/= 4+/5 w/o pain by 01/30/16   Status On-going     PT LONG TERM GOAL #4   Title Pt will routinely demonstrate neutral spine and shoulder posture w/o cues at least 75% of time by 01/30/16   Status On-going     PT LONG TERM GOAL #5   Title Pt will report 75% or better improvement in use of L UE with normal ADL's and functional tasks w/o limitation due to shoulder pain by 01/30/16   Status On-going               Plan - 01/17/16 1320    Clinical Impression Statement Pt. reporting she is going on cruise  vacation and may need to rescheduled appointments in early January.  Pt. reporting worse L shoulder pain the last few days due to lowering herself to floor with L shoulder/wrist while playing with grandchildren.  Scapular/RTC strengthening therex mildly progressed today with pt. tolerating well.  Pt. TTP over anterior deltoid today and very guarded with flexion, ER motions with frequent biceps muscle spasms.  Pt. reporting reaching behind back to use bath towel following shower is still difficult at this point.  Pt. will continues to benefit from skilled PT to improve L shoulder strength with functional movements.    PT Treatment/Interventions Patient/family education;Neuromuscular re-education;Therapeutic exercise;Manual techniques;Taping;Dry needling;Therapeutic activities;Electrical Stimulation;Moist Heat;Cryotherapy;ADLs/Self Care Home Management   PT Next Visit Plan Postural training; Scapular  stabilization & strengthening; Manual therapy & modalities as indicated      Patient will benefit from skilled therapeutic intervention in order to improve the following deficits and impairments:  Pain, Postural dysfunction, Impaired flexibility, Decreased range of motion, Decreased strength, Increased muscle spasms, Decreased activity tolerance, Impaired UE functional use  Visit Diagnosis: Acute pain of left shoulder  Stiffness of left shoulder, not elsewhere classified  Abnormal posture     Problem List There are no active problems to display for this patient.   Bess Harvest, PTA 01/17/16 6:28 PM  [facility] [address]  [place], [place], [address] Phone: [phone]   Fax:  [phone]  Name: Carolyn [name] MRN: [id number] Date of Birth: 10-Jun-1950

## 2016-01-20 ENCOUNTER — Ambulatory Visit: Payer: Medicare Other | Admitting: Physical Therapy

## 2016-01-20 DIAGNOSIS — M25512 Pain in left shoulder: Secondary | ICD-10-CM

## 2016-01-20 DIAGNOSIS — M25612 Stiffness of left shoulder, not elsewhere classified: Secondary | ICD-10-CM

## 2016-01-20 DIAGNOSIS — R293 Abnormal posture: Secondary | ICD-10-CM

## 2016-01-20 NOTE — Therapy (Signed)
[facility] [address]  [place], [place], [address] Phone: [phone]   Fax:  [phone]  Physical Therapy Treatment  Patient Details  Name: Carolyn [name] MRN: [id number] Date of Birth: Dec 23, 1950 Referring Provider: Jolyn Nap, MD  Encounter Date: 01/20/2016      PT End of Session - 01/20/16 1107    Visit Number 6   Number of Visits 8   Date for PT Re-Evaluation 01/30/16   Authorization Type Medicare   PT Start Time 1107   PT Stop Time 1149   PT Time Calculation (min) 42 min   Activity Tolerance Patient tolerated treatment well   Behavior During Therapy [facility] for tasks assessed/performed      Past Medical History:  Diagnosis Date  . Osteoporosis     Past Surgical History:  Procedure Laterality Date  . ABDOMINAL HYSTERECTOMY    . CESAREAN SECTION    . OOPHORECTOMY      There were no vitals filed for this visit.      Subjective Assessment - 01/20/16 1111    Subjective Pt reports she has not been given a definitive date for her surgery yet, but will be traveling from Jan 5-13 and anticipates surgery the week she returns. Still noting increased wrist pain since landing hard on it while playing with her grandchildren.   Patient Stated Goals "To get my shoulder back to normal."   Currently in Pain? Yes   Pain Score --  1.5/10   Pain Location Arm   Pain Orientation Left;Upper   Pain Descriptors / Indicators Dull;Aching                         [facility] Adult PT Treatment/Exercise - 01/20/16 1107      Shoulder Exercises: Supine   Protraction Left;15 reps;Weights   Protraction Weight (lbs) 2   Other Supine Exercises L shoulder circles at 90 dg flex CW/CCW 2# x10     Shoulder Exercises: Sidelying   External Rotation Strengthening;Left;15 reps;Weights   External Rotation Weight (lbs) 2   External Rotation Limitations VC/TC for scap retraction   ABduction  Strengthening;Left;15 reps;Weights   ABduction Weight (lbs) 2   Other Sidelying Exercises L shoulder circles at 90 dg ABD CW/CCW 2# x10     Shoulder Exercises: Standing   External Rotation Strengthening;Left;15 reps;Theraband   Theraband Level (Shoulder External Rotation) Level 2 (Red)   External Rotation Limitations towel under arm   Internal Rotation Strengthening;Left;15 reps;Theraband   Theraband Level (Shoulder Internal Rotation) Level 2 (Red)   Internal Rotation Weight (lbs) towel under arm   Other Standing Exercises B shoulder ER with red TB standing against doorframe to promote scap retraction 15x3"     Shoulder Exercises: ROM/Strengthening   UBE (Upper Arm Bike) lvl 2.0 fwd/back x 3' each     Shoulder Exercises: Stretch   Other Shoulder Stretches L shoulder IR sleeper stretch 2x30"     Manual Therapy   Manual Therapy Soft tissue mobilization;Passive ROM;Myofascial release   Soft tissue mobilization L pecs, supraspinatus, teres major/minor & subscapularis   Myofascial Release L subscapularis   Passive ROM L shoulder flexion, ER & IR                PT Education - 01/20/16 1150    Education provided Yes   Education Details HEP update   Person(s) Educated Patient   Methods Explanation;Demonstration;Handout   Comprehension Verbalized understanding;Returned demonstration;Need  further instruction             PT Long Term Goals - 01/05/16 1105      PT LONG TERM GOAL #1   Title Independent with HEP by 01/30/16   Status On-going     PT LONG TERM GOAL #2   Title L shoudler ROM equivalent to R w/o pain by 01/30/16   Status On-going     PT LONG TERM GOAL #3   Title L shoulder MMT >/= 4+/5 w/o pain by 01/30/16   Status On-going     PT LONG TERM GOAL #4   Title Pt will routinely demonstrate neutral spine and shoulder posture w/o cues at least 75% of time by 01/30/16   Status On-going     PT LONG TERM GOAL #5   Title Pt will report 75% or better improvement in use  of L UE with normal ADL's and functional tasks w/o limitation due to shoulder pain by 01/30/16   Status On-going               Plan - 01/20/16 1115    Clinical Impression Statement Pt reporting attempt to perform shoulder ER/IR at home on own with green TB with poor tolerance. Reviewed proper technique with pt able to perform correctly w/o increased pain, therefore provided written instructions with exercises to be performed with red TB at home. Pt also noting difficulty with IR towel stretch, therefore instructed in alternative version of IR stretch with sleeper stretch. STM/MFR of RTC muscles allowing for improved L shoulder IR/ER.    PT Treatment/Interventions Patient/family education;Neuromuscular re-education;Therapeutic exercise;Manual techniques;Taping;Dry needling;Therapeutic activities;Electrical Stimulation;Moist Heat;Cryotherapy;ADLs/Self Care Home Management   PT Next Visit Plan Postural training; Scapular stabilization & strengthening; Manual therapy & modalities as indicated      Patient will benefit from skilled therapeutic intervention in order to improve the following deficits and impairments:  Pain, Postural dysfunction, Impaired flexibility, Decreased range of motion, Decreased strength, Increased muscle spasms, Decreased activity tolerance, Impaired UE functional use  Visit Diagnosis: Acute pain of left shoulder  Stiffness of left shoulder, not elsewhere classified  Abnormal posture     Problem List There are no active problems to display for this patient.   Percival Spanish, PT, MPT 01/20/2016, 12:02 PM  [facility] [address]  [place], [place], [address] Phone: [phone]   Fax:  [phone]  Name: Carolyn [name] MRN: [id number] Date of Birth: 12-08-1950

## 2016-01-24 ENCOUNTER — Ambulatory Visit: Payer: Medicare HMO | Attending: Orthopedic Surgery

## 2016-01-24 DIAGNOSIS — M25512 Pain in left shoulder: Secondary | ICD-10-CM | POA: Diagnosis not present

## 2016-01-24 DIAGNOSIS — R293 Abnormal posture: Secondary | ICD-10-CM | POA: Insufficient documentation

## 2016-01-24 DIAGNOSIS — M25612 Stiffness of left shoulder, not elsewhere classified: Secondary | ICD-10-CM

## 2016-01-24 NOTE — Therapy (Signed)
[facility] [address]  [place], [place], [address] Phone: [phone]   Fax:  [phone]  Physical Therapy Treatment  Patient Details  Name: Carolyn [name] MRN: [id number] Date of Birth: 17-Dec-1950 Referring Provider: Jolyn Nap, MD  Encounter Date: 01/24/2016      PT End of Session - 01/24/16 0807    Visit Number 7   Number of Visits 8   Date for PT Re-Evaluation 01/30/16   Authorization Type Medicare   PT Start Time 0802   PT Stop Time 0842   PT Time Calculation (min) 40 min   Activity Tolerance Patient tolerated treatment well   Behavior During Therapy [facility] for tasks assessed/performed      Past Medical History:  Diagnosis Date  . Osteoporosis     Past Surgical History:  Procedure Laterality Date  . ABDOMINAL HYSTERECTOMY    . CESAREAN SECTION    . OOPHORECTOMY      There were no vitals filed for this visit.      Subjective Assessment - 01/24/16 0807    Subjective Pt. reporting she feels that the L shoulder is doing better at this point.     Patient Stated Goals "To get my shoulder back to normal."   Currently in Pain? No/denies   Pain Score 0-No pain   Multiple Pain Sites No            [facility] PT Assessment - 01/24/16 0824      Observation/Other Assessments   Focus on Therapeutic Outcomes (FOTO)  64% (36% limitation)     AROM   AROM Assessment Site Shoulder   Right Shoulder Flexion 157 Degrees   Right Shoulder ABduction 180 Degrees   Right Shoulder Internal Rotation 100 Degrees   Right Shoulder External Rotation 96 Degrees   Left Shoulder Flexion 174 Degrees  seated   Left Shoulder ABduction 179 Degrees  seated   Left Shoulder Internal Rotation 85 Degrees  prone   Left Shoulder External Rotation 80 Degrees  prone   Right/Left Forearm Right;Left   Right Forearm Pronation 97 Degrees   Right Forearm Supination 100 Degrees   Left Forearm Pronation 95 Degrees   Left  Forearm Supination 90 Degrees     Strength   Strength Assessment Site Shoulder   Right/Left Shoulder Right;Left   Right Shoulder Flexion 5/5   Right Shoulder ABduction 5/5   Right Shoulder Internal Rotation 5/5   Right Shoulder External Rotation 5/5   Left Shoulder Flexion 5/5   Left Shoulder ABduction 5/5   Left Shoulder Internal Rotation 4+/5   Left Shoulder External Rotation 4+/5   Right/Left Elbow Right;Left   Right Elbow Flexion 5/5   Right Elbow Extension 4+/5   Left Elbow Flexion 4+/5   Left Elbow Extension 4/5              [facility] Adult PT Treatment/Exercise - 01/24/16 0811      Shoulder Exercises: Standing   External Rotation Strengthening;Left;15 reps;Theraband   Theraband Level (Shoulder External Rotation) Level 2 (Red)  modified to accomodate wrist surgery    External Rotation Limitations HEP review    Extension Strengthening;Both;15 reps   Theraband Level (Shoulder Extension) Level 2 (Red)  modified band used to accomodate wrist surgeyr      Shoulder Exercises: 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 now able to perform high stretch with only mild pain  Other Shoulder Stretches L shoulder IR sleeper stretch 2x30"           PT Long Term Goals - 2016/02/14 4782      PT LONG TERM GOAL #1   Title Independent with HEP by 01/30/16   Status Achieved     PT LONG TERM GOAL #2   Title L shoudler ROM equivalent to R w/o pain by 01/30/16   Status Partially Met  2016/02/14: pt. now demonstrating nearly equivalent motions to R shoulder      PT LONG TERM GOAL #3   Title L shoulder MMT >/= 4+/5 w/o pain by 01/30/16   Status Achieved     PT LONG TERM GOAL #4   Title Pt will routinely demonstrate neutral spine and shoulder posture w/o cues at least 75% of time by 01/30/16   Status Partially Met  02-14-16: Pt. reporting she frequently corrects her own posture at this point however still tends to have forward head  postures.     PT LONG TERM GOAL #5   Title Pt will report 75% or better improvement in use of L UE with normal ADL's and functional tasks w/o limitation due to shoulder pain by 01/30/16   Status Partially Met  February 14, 2016: Pt. reporting she is still limited reaching behind/up back               Plan - 02/14/2016 9562    Clinical Impression Statement Pt. to schedule L wrist surgery later today and wishes to d/c today due to insurance reasons.  Pt. reporting she feels comfortable transitioning to home program leading to upcoming wrist surgery.  Pt. demonstrating improvement in L shoulder AROM today: flexion 174 dg, abduction 179 dg, IR 85, ER 80.  Pt. now nearly symmetrical L shoulder ROM to R shoulder within 10 dg in all motions with exception of IR still slightly limited.  Pt. able to demo 4+/5 or better strength in all L shoulder motions today and pain free with all strength and ROM testing.  HEP reviewed with pt. today with pt. encouraged to concentrate on sleeper stretch and high doorway stretch.  Modified red TB issued to pt. to allow for easy use of L wrist following surgery.  Possibility of d/c discussed with PT today with PT agreeing to d/c.     PT Treatment/Interventions Patient/family education;Neuromuscular re-education;Therapeutic exercise;Manual techniques;Taping;Dry needling;Therapeutic activities;Electrical Stimulation;Moist Heat;Cryotherapy;ADLs/Self Care Home Management   PT Next Visit Plan pt. d/c      Patient will benefit from skilled therapeutic intervention in order to improve the following deficits and impairments:  Pain, Postural dysfunction, Impaired flexibility, Decreased range of motion, Decreased strength, Increased muscle spasms, Decreased activity tolerance, Impaired UE functional use  Visit Diagnosis: Acute pain of left shoulder  Stiffness of left shoulder, not elsewhere classified  Abnormal posture       G-Codes - 14-Feb-2016 0946    Functional Assessment Tool  Used FOTO = 64% (36% limitation)   Functional Limitation Carrying, moving and handling objects   Carrying, Moving and Handling Objects Goal Status ([id number]) At least 20 percent but less than 40 percent impaired, limited or restricted   Carrying, Moving and Handling Objects Discharge Status [phone]) At least 20 percent but less than 40 percent impaired, limited or restricted      Problem List There are no active problems to display for this patient.   Bess Harvest, PTA 14-Feb-2016 10:15 AM  [facility] [address]  [place], [place], [address] Phone: [phone]   Fax:  [phone]  Name: Carolyn [name] MRN: [id number] Date of Birth: 07/13/1950   PHYSICAL THERAPY DISCHARGE SUMMARY  Visits from Start of Care: 7  Current functional level related to goals / functional outcomes:    Refer to above clinical impression.   Remaining deficits:    As above.   Education / Equipment:    HEP  Plan: Patient agrees to discharge.  Patient goals were partially met. Patient is being discharged due to the patient's request.  ?????    Percival Spanish, PT, MPT 01/24/16, 10:20 AM  [facility] [address]  [place], [place], [address] Phone: [phone]   Fax:  [phone]

## 2016-01-26 ENCOUNTER — Ambulatory Visit: Payer: Medicare HMO | Admitting: Physical Therapy

## 2016-01-27 ENCOUNTER — Encounter (HOSPITAL_BASED_OUTPATIENT_CLINIC_OR_DEPARTMENT_OTHER): Payer: Self-pay | Admitting: *Deleted

## 2016-02-02 ENCOUNTER — Other Ambulatory Visit: Payer: Self-pay | Admitting: Orthopedic Surgery

## 2016-02-03 NOTE — H&P (Signed)
Carolyn [name] is an 66 y.o. female.   CC / Reason for Visit: Left thumb; left shoulder HPI: This patient returns to [place] today secondary to some concerns in regards to her left thumb as well as left shoulder pain.  The patient states that approximately 3 weeks ago she was seated when she felt a strange sharp pain on the radial aspect of her wrist while wearing her splint.  She was not performing any type of activity at that time.  She noted after the sharp pain, the IP joint of her thumb rested in flexion whereas the other rests in a bit of hyperextension.  She also indicates that she's been experiencing left-sided shoulder pain that has been present since falling.  She indicates that the pain is diffuse in nature, but mostly posterior upper arm about the triceps and into the upper trapezius and intrascapular areas.  She indicates that she takes over-the-counter type medications occasionally, which are somewhat helpful but not on a regular basis.  She further reports that she will be going on a cruise in early January.  Past Medical History:  Diagnosis Date  . Arthritis    neck  . Complication of anesthesia   . Osteoporosis   . PONV (postoperative nausea and vomiting)   . Thumb pain, left     Past Surgical History:  Procedure Laterality Date  . ABDOMINAL HYSTERECTOMY    . CESAREAN SECTION    . OOPHORECTOMY      History reviewed. No pertinent family history. Social History:  reports that she has never smoked. She has never used smokeless tobacco. She reports that she drinks about 6.0 oz of alcohol per week . She reports that she does not use drugs.  Allergies:  Allergies  Allergen Reactions  . Sulfa Antibiotics Swelling    No prescriptions prior to admission.    No results found for this or any previous visit (from the past 48 hour(s)). No results found.  Review of Systems  All other systems reviewed and are negative.   Height 5' 4.5" (1.638 m), weight 59 kg (130  lb). Physical Exam  Constitutional:  WD, WN, NAD HEENT:  NCAT, EOMI Neuro/Psych:  Alert & oriented to person, place, and time; appropriate mood & affect Lymphatic: No generalized UE edema or lymphadenopathy Extremities / MSK:  Both UE are normal with respect to appearance, ranges of motion, joint stability, muscle strength/tone, sensation, & perfusion except as otherwise noted:  The left thumb rests in a flexed position.  The patient is unable to extend the IP actively.  The deficit is an extensor lag as it is soft and can be passively extended completely.  There is pain and no tension felt when palpating along the EPL tendon specifically into the first dorsal compartment about the anatomical snuffbox.  The patient is unable to extend the thumb from the tabletop.   The left shoulder has full range of motion when compared contralaterally.  There is some tightness in external rotation.  Lift off test is negative as is belly press test.  There is some pain with palpation about the left upper trapezius, and a trigger point easily felt.  Labs / Xrays:  No radiographic studies obtained today.  Assessment: 1.  Left EPL tendon rupture 2.  Left shoulder pain  Plan:  The findings are discussed with the patient.  Surgical repair versus tendon transfer was also discussed at length.  The patient wishes to postpone her surgery until after the first of the  year and her cruise.  She will move forward with a L EIP to EPL transfer.  She will also move forward with some formalized therapy for her left shoulder and provided a prescription. The details of the operative procedure were discussed with the patient.  Questions were invited and answered.  In addition to the goal of the procedure, the risks of the procedure to include but not limited to bleeding; infection; damage to the nerves or blood vessels that could result in bleeding, numbness, weakness, chronic pain, and the need for additional procedures;  stiffness; the need for revision surgery; and anesthetic risks were reviewed.  No specific outcome was guaranteed or implied.    Avaya Mcjunkins A., MD 02/03/2016, 10:57 PM

## 2016-02-07 ENCOUNTER — Ambulatory Visit (HOSPITAL_BASED_OUTPATIENT_CLINIC_OR_DEPARTMENT_OTHER)
Admission: RE | Admit: 2016-02-07 | Discharge: 2016-02-07 | Disposition: A | Discharge status: Home / Self Care | Payer: Medicare HMO | Source: Ambulatory Visit | Attending: Orthopedic Surgery | Admitting: Orthopedic Surgery

## 2016-02-07 ENCOUNTER — Encounter (HOSPITAL_BASED_OUTPATIENT_CLINIC_OR_DEPARTMENT_OTHER): Payer: Self-pay | Admitting: Certified Registered"

## 2016-02-07 ENCOUNTER — Ambulatory Visit (HOSPITAL_BASED_OUTPATIENT_CLINIC_OR_DEPARTMENT_OTHER): Payer: Medicare HMO | Admitting: Certified Registered"

## 2016-02-07 ENCOUNTER — Encounter (HOSPITAL_BASED_OUTPATIENT_CLINIC_OR_DEPARTMENT_OTHER)
Admission: RE | Disposition: A | Discharge status: Home / Self Care | Payer: Self-pay | Source: Ambulatory Visit | Attending: Orthopedic Surgery

## 2016-02-07 DIAGNOSIS — Z79899 Other long term (current) drug therapy: Secondary | ICD-10-CM | POA: Diagnosis not present

## 2016-02-07 DIAGNOSIS — M66242 Spontaneous rupture of extensor tendons, left hand: Secondary | ICD-10-CM | POA: Insufficient documentation

## 2016-02-07 DIAGNOSIS — M25512 Pain in left shoulder: Secondary | ICD-10-CM | POA: Diagnosis not present

## 2016-02-07 DIAGNOSIS — S66212A Strain of extensor muscle, fascia and tendon of left thumb at wrist and hand level, initial encounter: Secondary | ICD-10-CM | POA: Diagnosis not present

## 2016-02-07 DIAGNOSIS — S66292A Other specified injury of extensor muscle, fascia and tendon of left thumb at wrist and hand level, initial encounter: Secondary | ICD-10-CM | POA: Diagnosis not present

## 2016-02-07 HISTORY — DX: Other complications of anesthesia, initial encounter: T88.59XA

## 2016-02-07 HISTORY — PX: TENDON TRANSFER: SHX6109

## 2016-02-07 HISTORY — DX: Adverse effect of unspecified anesthetic, initial encounter: T41.45XA

## 2016-02-07 HISTORY — DX: Pain in left finger(s): M79.645

## 2016-02-07 HISTORY — DX: Other specified postprocedural states: Z98.890

## 2016-02-07 HISTORY — DX: Nausea with vomiting, unspecified: R11.2

## 2016-02-07 HISTORY — DX: Unspecified osteoarthritis, unspecified site: M19.90

## 2016-02-07 SURGERY — TRANSFER, TENDON
Anesthesia: General | Site: Thumb | Laterality: Left

## 2016-02-07 MED ORDER — FENTANYL CITRATE (PF) 100 MCG/2ML IJ SOLN
50.0000 ug | INTRAMUSCULAR | Status: DC | PRN
  Administered 2016-02-07: 100 ug via INTRAVENOUS

## 2016-02-07 MED ORDER — KETOROLAC TROMETHAMINE 30 MG/ML IJ SOLN
INTRAMUSCULAR | Status: DC | PRN
  Administered 2016-02-07: 30 mg via INTRAVENOUS

## 2016-02-07 MED ORDER — MIDAZOLAM HCL 2 MG/2ML IJ SOLN
INTRAMUSCULAR | Status: AC
  Filled 2016-02-07: qty 2

## 2016-02-07 MED ORDER — IBUPROFEN 200 MG PO TABS: 600.0000 mg | ORAL_TABLET | Freq: Four times a day (QID) | ORAL | 0 refills | Status: AC | PRN

## 2016-02-07 MED ORDER — CEFAZOLIN SODIUM-DEXTROSE 2-4 GM/100ML-% IV SOLN
2.0000 g | INTRAVENOUS | Status: AC
  Administered 2016-02-07: 2 g via INTRAVENOUS

## 2016-02-07 MED ORDER — LACTATED RINGERS IV SOLN
INTRAVENOUS | Status: DC
  Administered 2016-02-07 (×2): via INTRAVENOUS

## 2016-02-07 MED ORDER — LACTATED RINGERS IV SOLN
INTRAVENOUS | Status: DC
  Administered 2016-02-07: 14:00:00 via INTRAVENOUS

## 2016-02-07 MED ORDER — ACETAMINOPHEN 325 MG PO TABS: 650.0000 mg | ORAL_TABLET | Freq: Four times a day (QID) | ORAL | Status: DC | PRN

## 2016-02-07 MED ORDER — BUPIVACAINE-EPINEPHRINE 0.5% -1:200000 IJ SOLN
INTRAMUSCULAR | Status: DC | PRN
  Administered 2016-02-07: 10 mL

## 2016-02-07 MED ORDER — HYDROMORPHONE HCL 1 MG/ML IJ SOLN: 0.2500 mg | INTRAMUSCULAR | Status: DC | PRN

## 2016-02-07 MED ORDER — EPHEDRINE SULFATE 50 MG/ML IJ SOLN
INTRAMUSCULAR | Status: DC | PRN
  Administered 2016-02-07: 10 mg via INTRAVENOUS

## 2016-02-07 MED ORDER — PROPOFOL 10 MG/ML IV BOLUS
INTRAVENOUS | Status: DC | PRN
  Administered 2016-02-07: 160 mg via INTRAVENOUS

## 2016-02-07 MED ORDER — MIDAZOLAM HCL 2 MG/2ML IJ SOLN
1.0000 mg | INTRAMUSCULAR | Status: DC | PRN
Start: 2016-02-07 — End: 2016-02-07
  Administered 2016-02-07: 2 mg via INTRAVENOUS

## 2016-02-07 MED ORDER — PROPOFOL 10 MG/ML IV BOLUS
INTRAVENOUS | Status: AC
  Filled 2016-02-07: qty 20

## 2016-02-07 MED ORDER — FENTANYL CITRATE (PF) 100 MCG/2ML IJ SOLN
INTRAMUSCULAR | Status: AC
  Filled 2016-02-07: qty 2

## 2016-02-07 MED ORDER — ONDANSETRON HCL 4 MG PO TABS: 4.0000 mg | ORAL_TABLET | Freq: Three times a day (TID) | ORAL | 0 refills | Status: DC | PRN

## 2016-02-07 MED ORDER — LIDOCAINE HCL (CARDIAC) 20 MG/ML IV SOLN
INTRAVENOUS | Status: DC | PRN
  Administered 2016-02-07: 100 mg via INTRAVENOUS

## 2016-02-07 MED ORDER — SCOPOLAMINE 1 MG/3DAYS TD PT72: 1.0000 | MEDICATED_PATCH | Freq: Once | TRANSDERMAL | Status: DC | PRN

## 2016-02-07 MED ORDER — DEXAMETHASONE SODIUM PHOSPHATE 10 MG/ML IJ SOLN
INTRAMUSCULAR | Status: DC | PRN
Start: 2016-02-07 — End: 2016-02-07
  Administered 2016-02-07: 10 mg via INTRAVENOUS

## 2016-02-07 MED ORDER — OXYCODONE HCL 5 MG PO TABS: 5.0000 mg | ORAL_TABLET | Freq: Four times a day (QID) | ORAL | 0 refills | Status: DC | PRN

## 2016-02-07 SURGICAL SUPPLY — 47 items
BLADE MINI RND TIP GREEN BEAV (BLADE) IMPLANT
BLADE SURG 15 STRL LF DISP TIS (BLADE) ×1 IMPLANT
BLADE SURG 15 STRL SS (BLADE) ×1
BNDG COHESIVE 4X5 TAN STRL (GAUZE/BANDAGES/DRESSINGS) ×2 IMPLANT
BNDG ESMARK 4X9 LF (GAUZE/BANDAGES/DRESSINGS) ×2 IMPLANT
BNDG GAUZE ELAST 4 BULKY (GAUZE/BANDAGES/DRESSINGS) ×2 IMPLANT
CHLORAPREP W/TINT 26ML (MISCELLANEOUS) ×2 IMPLANT
CORDS BIPOLAR (ELECTRODE) ×2 IMPLANT
COVER BACK TABLE 60X90IN (DRAPES) ×2 IMPLANT
CUFF TOURNIQUET SINGLE 18IN (TOURNIQUET CUFF) ×2 IMPLANT
DECANTER SPIKE VIAL GLASS SM (MISCELLANEOUS) IMPLANT
DRAPE EXTREMITY T 121X128X90 (DRAPE) ×2 IMPLANT
DRAPE SURG 17X23 STRL (DRAPES) ×2 IMPLANT
DRSG EMULSION OIL 3X3 NADH (GAUZE/BANDAGES/DRESSINGS) ×2 IMPLANT
GLOVE BIO SURGEON STRL SZ7.5 (GLOVE) ×2 IMPLANT
GLOVE BIOGEL PI IND STRL 7.0 (GLOVE) ×2 IMPLANT
GLOVE BIOGEL PI IND STRL 8 (GLOVE) ×1 IMPLANT
GLOVE BIOGEL PI INDICATOR 7.0 (GLOVE) ×2
GLOVE BIOGEL PI INDICATOR 8 (GLOVE) ×1
GLOVE ECLIPSE 6.5 STRL STRAW (GLOVE) ×4 IMPLANT
GOWN STRL REUS W/ TWL LRG LVL3 (GOWN DISPOSABLE) ×2 IMPLANT
GOWN STRL REUS W/TWL LRG LVL3 (GOWN DISPOSABLE) ×2
GOWN STRL REUS W/TWL XL LVL3 (GOWN DISPOSABLE) ×2 IMPLANT
LOOP VESSEL MAXI BLUE (MISCELLANEOUS) IMPLANT
LOOP VESSEL MINI RED (MISCELLANEOUS) IMPLANT
NEEDLE HYPO 25X1 1.5 SAFETY (NEEDLE) ×2 IMPLANT
NS IRRIG 1000ML POUR BTL (IV SOLUTION) ×2 IMPLANT
PACK BASIN DAY SURGERY FS (CUSTOM PROCEDURE TRAY) ×2 IMPLANT
PADDING CAST ABS 4INX4YD NS (CAST SUPPLIES) ×1
PADDING CAST ABS COTTON 4X4 ST (CAST SUPPLIES) ×1 IMPLANT
SHEET MEDIUM DRAPE 40X70 STRL (DRAPES) ×2 IMPLANT
SLEEVE SCD COMPRESS KNEE MED (MISCELLANEOUS) ×2 IMPLANT
SPLINT PLASTER CAST XFAST 3X15 (CAST SUPPLIES) ×7 IMPLANT
SPLINT PLASTER XTRA FASTSET 3X (CAST SUPPLIES) ×7
SPONGE GAUZE 4X4 12PLY STER LF (GAUZE/BANDAGES/DRESSINGS) ×2 IMPLANT
STOCKINETTE 6  STRL (DRAPES) ×1
STOCKINETTE 6 STRL (DRAPES) ×1 IMPLANT
SUT ETHIBOND 2-0 V-5 NEEDLE (SUTURE) IMPLANT
SUT ETHIBOND 3-0 V-5 (SUTURE) ×2 IMPLANT
SUT STEEL 4 (SUTURE) ×2 IMPLANT
SUT SUPRAMID 3-0 (SUTURE) IMPLANT
SUT VICRYL RAPIDE 4-0 (SUTURE) IMPLANT
SUT VICRYL RAPIDE 4/0 PS 2 (SUTURE) ×2 IMPLANT
SYR 10ML LL (SYRINGE) ×2 IMPLANT
SYR BULB 3OZ (MISCELLANEOUS) ×2 IMPLANT
TOWEL OR 17X24 6PK STRL BLUE (TOWEL DISPOSABLE) ×2 IMPLANT
UNDERPAD 30X30 (UNDERPADS AND DIAPERS) ×2 IMPLANT

## 2016-02-07 NOTE — Discharge Instructions (Signed)
Discharge Instructions   You have a dressing with a plaster splint incorporated in it. Move your fingers as much as possible, making a full fist and fully opening the fist. Elevate your hand above your elbow to reduce pain & swelling of the digits.  Ice over the operative site and in your arm pit may be helpful to reduce pain & swelling.  DO NOT USE HEAT. Pain medicine has been prescribed for you.  Take the over the counter Ibuprofen and Tylenol every 6 hours. Take the pain medicine for severe breakthrough pain as prescribed.  Leave the dressing in place until you return to our office.  You may shower, but keep the bandage clean & dry.  You may drive a car when you are off of prescription pain medications and can safely control your vehicle with both hands. Our office will call you to arrange follow-up   Please call [phone] during normal business hours or [phone] after hours for any problems. Including the following:  - excessive redness of the incisions - drainage for more than 4 days - fever of more than 101.5 F  *Please note that pain medications will not be refilled after hours or on weekends.   Post Anesthesia Home Care Instructions  Activity: Get plenty of rest for the remainder of the day. A responsible adult should stay with you for 24 hours following the procedure.  For the next 24 hours, DO NOT: -Drive a car -Paediatric nurse -Drink alcoholic beverages -Take any medication unless instructed by your physician -Make any legal decisions or sign important papers.  Meals: Start with liquid foods such as gelatin or soup. Progress to regular foods as tolerated. Avoid greasy, spicy, heavy foods. If nausea and/or vomiting occur, drink only clear liquids until the nausea and/or vomiting subsides. Call your physician if vomiting continues.  Special Instructions/Symptoms: Your throat may feel dry or sore from the anesthesia or the breathing tube placed in your throat  during surgery. If this causes discomfort, gargle with warm salt water. The discomfort should disappear within 24 hours.  If you had a scopolamine patch placed behind your ear for the management of post- operative nausea and/or vomiting:  1. The medication in the patch is effective for 72 hours, after which it should be removed.  Wrap patch in a tissue and discard in the trash. Wash hands thoroughly with soap and water. 2. You may remove the patch earlier than 72 hours if you experience unpleasant side effects which may include dry mouth, dizziness or visual disturbances. 3. Avoid touching the patch. Wash your hands with soap and water after contact with the patch.

## 2016-02-07 NOTE — Interval H&P Note (Signed)
History and Physical Interval Note:  02/07/2016 2:09 PM  [name]  has presented today for surgery, with the diagnosis of LEFT EXTENSOR POLLICIS LONGIS RUPTURE [id number]  The various methods of treatment have been discussed with the patient and family. After consideration of risks, benefits and other options for treatment, the patient has consented to  Procedure(s): LEFT THUMB TENDON TRANSFER (Left) as a surgical intervention .  [name] history has been reviewed, patient examined, no change in status, stable for surgery.  I have reviewed the patient's chart and labs.  Questions were answered to the patient's satisfaction.     Carolyn [name] A.

## 2016-02-07 NOTE — Anesthesia Preprocedure Evaluation (Addendum)
Anesthesia Evaluation  Patient identified by MRN, date of birth, ID band Patient awake    Reviewed: Allergy & Precautions, NPO status , Patient's Chart, lab work & pertinent test results  History of Anesthesia Complications (+) PONV  Airway Mallampati: II  TM Distance: >3 FB     Dental   Pulmonary    breath sounds clear to auscultation       Cardiovascular negative cardio ROS   Rhythm:Regular Rate:Normal     Neuro/Psych    GI/Hepatic negative GI ROS, Neg liver ROS,   Endo/Other  negative endocrine ROS  Renal/GU negative Renal ROS     Musculoskeletal   Abdominal   Peds  Hematology negative hematology ROS (+)   Anesthesia Other Findings   Reproductive/Obstetrics                             Anesthesia Physical Anesthesia Plan  ASA: II  Anesthesia Plan: General   Post-op Pain Management:    Induction: Intravenous  Airway Management Planned: LMA  Additional Equipment:   Intra-op Plan:   Post-operative Plan: Extubation in OR  Informed Consent: I have reviewed the patients History and Physical, chart, labs and discussed the procedure including the risks, benefits and alternatives for the proposed anesthesia with the patient or authorized representative who has indicated his/her understanding and acceptance.     Plan Discussed with: CRNA and Anesthesiologist  Anesthesia Plan Comments:         Anesthesia Quick Evaluation

## 2016-02-07 NOTE — Transfer of Care (Signed)
Immediate Anesthesia Transfer of Care Note  Patient: Ally Hemphill  Procedure(s) Performed: Procedure(s): LEFT THUMB TENDON TRANSFER (Left)  Patient Location: PACU  Anesthesia Type:General  Level of Consciousness: sedated  Airway & Oxygen Therapy: Patient Spontanous Breathing and Patient connected to face mask oxygen  Post-op Assessment: Report given to RN and Post -op Vital signs reviewed and stable  Post vital signs: Reviewed and stable  Last Vitals:  Vitals:   02/07/16 1323 02/07/16 1522  BP: 112/67   Pulse: 75 78  Resp: 16   Temp: 36.8 C     Last Pain:  Vitals:   02/07/16 1323  TempSrc: Oral         Complications: No apparent anesthesia complications

## 2016-02-07 NOTE — Anesthesia Procedure Notes (Signed)
Procedure Name: LMA Insertion Date/Time: 02/07/2016 2:35 PM Performed by: Maryella Shivers Pre-anesthesia Checklist: Patient identified, Emergency Drugs available, Suction available and Patient being monitored Patient Re-evaluated:Patient Re-evaluated prior to inductionOxygen Delivery Method: Circle system utilized Preoxygenation: Pre-oxygenation with 100% oxygen Intubation Type: IV induction Ventilation: Mask ventilation without difficulty LMA: LMA inserted LMA Size: 4.0 Number of attempts: 1 Airway Equipment and Method: Bite block Placement Confirmation: positive ETCO2 Tube secured with: Tape Dental Injury: Teeth and Oropharynx as per pre-operative assessment

## 2016-02-07 NOTE — Op Note (Signed)
02/07/2016  2:09 PM  PATIENT:  Carolyn [name]  66 y.o. female  PRE-OPERATIVE DIAGNOSIS:  Chronic left EPL rupture  POST-OPERATIVE DIAGNOSIS:  Same  PROCEDURE:  Left EIP to EPL transfer  SURGEON: Rayvon Char. Grandville Silos, MD  PHYSICIAN ASSISTANT: Morley Kos, OPA-C  ANESTHESIA:  general  SPECIMENS:  None  DRAINS:   None  EBL:  less than 50 mL  PREOPERATIVE INDICATIONS:  Jenavi Schlotfeldt is a  66 y.o. female with a chronic left EPL rupture and insufficient active extension at the thumb IP joint.  The risks benefits and alternatives were discussed with the patient preoperatively including but not limited to the risks of infection, bleeding, nerve injury, cardiopulmonary complications, the need for revision surgery, among others, and the patient verbalized understanding and consented to proceed.  OPERATIVE IMPLANTS: None  OPERATIVE PROCEDURE:  After receiving prophylactic antibiotics, the patient was escorted to the operative theatre and placed in a supine position.  General anesthesia was administered.  A surgical "time-out" was performed during which the planned procedure, proposed operative site, and the correct patient identity were compared to the operative consent and agreement confirmed by the circulating nurse according to current facility policy.  Following application of a tourniquet to the operative extremity, the exposed skin was prepped with Chloraprep and draped in the usual sterile fashion.  The limb was exsanguinated with an Esmarch bandage and the tourniquet inflated to approximately 118mmHg higher than systolic BP.  Plan incisions were marked and anesthetized with half percent Marcaine bearing epinephrine, as well as superficial radial nerve and lateral antebrachial cutaneous nerve blocks at the wrist.  A 2 limb zigzag incision was created over the expected course of the EPL stump from about listers tubercle distally.  It was incised sharply with a scalpel and  full-thickness flaps elevated with care to protect and preserve cutaneous nerves.  The EPL stump was found, likely having ruptured listers tubercle.  10 a lysis was performed for the distal stump with some excision of scar about the tendon stump.  Once it was free and mobilized, a small incision was made over the index MCP joint where the EIP was harvested, and brought back to the level of listers tubercle where it was brought out of the fourth compartment to the distal stump of the EPL.  A Pulvertaft weave was performed with at least 4 passes, each secured by 3-0 Ethibond suture.  It was inset at will was thought to be the appropriate tension.  With flexion of the wrist, the IP joint extended nicely, and with flexion of the wrist the IP joint was very slightly flexed and resting against the index finger.  Excess EIP was discarded.  The wounds were irrigated and the tourniquet released.  Additional hemostasis wasn't necessary and the skin was closed with 4-0 Vicryl Rapide running horizontal mattress sutures.  A short arm splint dressing was applied with a plaster component and she was awakened and taken to the recovery room in stable condition, breathing spontaneously  DISPOSITION: She'll be discharged home today with typical instructions, returning in 7-10 days with follow-on hand therapy appointment for splint fabrication and initiation of rehabilitation.

## 2016-02-08 ENCOUNTER — Encounter (HOSPITAL_BASED_OUTPATIENT_CLINIC_OR_DEPARTMENT_OTHER): Payer: Self-pay | Admitting: Orthopedic Surgery

## 2016-02-08 NOTE — Anesthesia Postprocedure Evaluation (Signed)
Anesthesia Post Note  Patient: Carolyn [name]  Procedure(s) Performed: Procedure(s) (LRB): LEFT THUMB TENDON TRANSFER (Left)  Patient location during evaluation: PACU Anesthesia Type: General Level of consciousness: awake Pain management: pain level controlled Vital Signs Assessment: post-procedure vital signs reviewed and stable Respiratory status: spontaneous breathing Cardiovascular status: stable Anesthetic complications: no       Last Vitals:  Vitals:   02/07/16 1545 02/07/16 1615  BP: 107/71 115/66  Pulse: 87 88  Resp: 15 18  Temp:  36.5 C    Last Pain:  Vitals:   02/08/16 0934  TempSrc:   PainSc: 2                  [name]

## 2016-02-15 DIAGNOSIS — S66212D Strain of extensor muscle, fascia and tendon of left thumb at wrist and hand level, subsequent encounter: Secondary | ICD-10-CM | POA: Diagnosis not present

## 2016-02-15 DIAGNOSIS — M66242 Spontaneous rupture of extensor tendons, left hand: Secondary | ICD-10-CM | POA: Diagnosis not present

## 2016-02-24 DIAGNOSIS — M66242 Spontaneous rupture of extensor tendons, left hand: Secondary | ICD-10-CM | POA: Diagnosis not present

## 2016-03-01 DIAGNOSIS — M66242 Spontaneous rupture of extensor tendons, left hand: Secondary | ICD-10-CM | POA: Diagnosis not present

## 2016-03-05 DIAGNOSIS — M66242 Spontaneous rupture of extensor tendons, left hand: Secondary | ICD-10-CM | POA: Diagnosis not present

## 2016-03-08 DIAGNOSIS — M66242 Spontaneous rupture of extensor tendons, left hand: Secondary | ICD-10-CM | POA: Diagnosis not present

## 2016-03-13 DIAGNOSIS — M66242 Spontaneous rupture of extensor tendons, left hand: Secondary | ICD-10-CM | POA: Diagnosis not present

## 2016-03-16 DIAGNOSIS — M66242 Spontaneous rupture of extensor tendons, left hand: Secondary | ICD-10-CM | POA: Diagnosis not present

## 2016-03-20 DIAGNOSIS — S66212D Strain of extensor muscle, fascia and tendon of left thumb at wrist and hand level, subsequent encounter: Secondary | ICD-10-CM | POA: Diagnosis not present

## 2016-03-20 DIAGNOSIS — M66242 Spontaneous rupture of extensor tendons, left hand: Secondary | ICD-10-CM | POA: Diagnosis not present

## 2016-03-21 DIAGNOSIS — Z1231 Encounter for screening mammogram for malignant neoplasm of breast: Secondary | ICD-10-CM | POA: Diagnosis not present

## 2016-03-21 DIAGNOSIS — M859 Disorder of bone density and structure, unspecified: Secondary | ICD-10-CM | POA: Diagnosis not present

## 2016-03-23 DIAGNOSIS — M66242 Spontaneous rupture of extensor tendons, left hand: Secondary | ICD-10-CM | POA: Diagnosis not present

## 2016-03-29 DIAGNOSIS — M66242 Spontaneous rupture of extensor tendons, left hand: Secondary | ICD-10-CM | POA: Diagnosis not present

## 2016-04-03 DIAGNOSIS — M66242 Spontaneous rupture of extensor tendons, left hand: Secondary | ICD-10-CM | POA: Diagnosis not present

## 2016-04-06 DIAGNOSIS — M66242 Spontaneous rupture of extensor tendons, left hand: Secondary | ICD-10-CM | POA: Diagnosis not present

## 2016-04-09 DIAGNOSIS — Z01419 Encounter for gynecological examination (general) (routine) without abnormal findings: Secondary | ICD-10-CM | POA: Diagnosis not present

## 2016-04-09 DIAGNOSIS — M810 Age-related osteoporosis without current pathological fracture: Secondary | ICD-10-CM | POA: Diagnosis not present

## 2016-04-10 DIAGNOSIS — M66242 Spontaneous rupture of extensor tendons, left hand: Secondary | ICD-10-CM | POA: Diagnosis not present

## 2016-04-13 DIAGNOSIS — M66242 Spontaneous rupture of extensor tendons, left hand: Secondary | ICD-10-CM | POA: Diagnosis not present

## 2016-04-17 DIAGNOSIS — Z79899 Other long term (current) drug therapy: Secondary | ICD-10-CM | POA: Diagnosis not present

## 2016-04-17 DIAGNOSIS — H9190 Unspecified hearing loss, unspecified ear: Secondary | ICD-10-CM | POA: Diagnosis not present

## 2016-04-17 DIAGNOSIS — Z23 Encounter for immunization: Secondary | ICD-10-CM | POA: Diagnosis not present

## 2016-04-17 DIAGNOSIS — E785 Hyperlipidemia, unspecified: Secondary | ICD-10-CM | POA: Diagnosis not present

## 2016-04-17 DIAGNOSIS — M810 Age-related osteoporosis without current pathological fracture: Secondary | ICD-10-CM | POA: Diagnosis not present

## 2016-04-17 DIAGNOSIS — Z0000 Encounter for general adult medical examination without abnormal findings: Secondary | ICD-10-CM | POA: Diagnosis not present

## 2016-04-18 DIAGNOSIS — S66212D Strain of extensor muscle, fascia and tendon of left thumb at wrist and hand level, subsequent encounter: Secondary | ICD-10-CM | POA: Diagnosis not present

## 2016-04-18 DIAGNOSIS — M66242 Spontaneous rupture of extensor tendons, left hand: Secondary | ICD-10-CM | POA: Diagnosis not present

## 2016-05-02 DIAGNOSIS — M66242 Spontaneous rupture of extensor tendons, left hand: Secondary | ICD-10-CM | POA: Diagnosis not present

## 2016-05-30 DIAGNOSIS — S66212D Strain of extensor muscle, fascia and tendon of left thumb at wrist and hand level, subsequent encounter: Secondary | ICD-10-CM | POA: Diagnosis not present

## 2016-05-30 DIAGNOSIS — M66242 Spontaneous rupture of extensor tendons, left hand: Secondary | ICD-10-CM | POA: Diagnosis not present

## 2016-05-31 DIAGNOSIS — H5213 Myopia, bilateral: Secondary | ICD-10-CM | POA: Diagnosis not present

## 2016-11-29 DIAGNOSIS — D1801 Hemangioma of skin and subcutaneous tissue: Secondary | ICD-10-CM | POA: Diagnosis not present

## 2016-11-29 DIAGNOSIS — Z23 Encounter for immunization: Secondary | ICD-10-CM | POA: Diagnosis not present

## 2016-11-29 DIAGNOSIS — D225 Melanocytic nevi of trunk: Secondary | ICD-10-CM | POA: Diagnosis not present

## 2016-11-29 DIAGNOSIS — Z85828 Personal history of other malignant neoplasm of skin: Secondary | ICD-10-CM | POA: Diagnosis not present

## 2016-11-29 DIAGNOSIS — L821 Other seborrheic keratosis: Secondary | ICD-10-CM | POA: Diagnosis not present

## 2016-12-22 DIAGNOSIS — J019 Acute sinusitis, unspecified: Secondary | ICD-10-CM | POA: Diagnosis not present

## 2017-01-02 DIAGNOSIS — J019 Acute sinusitis, unspecified: Secondary | ICD-10-CM | POA: Diagnosis not present

## 2017-05-06 DIAGNOSIS — Z1231 Encounter for screening mammogram for malignant neoplasm of breast: Secondary | ICD-10-CM | POA: Diagnosis not present

## 2017-05-24 DIAGNOSIS — Z0000 Encounter for general adult medical examination without abnormal findings: Secondary | ICD-10-CM | POA: Diagnosis not present

## 2017-05-24 DIAGNOSIS — J302 Other seasonal allergic rhinitis: Secondary | ICD-10-CM | POA: Diagnosis not present

## 2017-05-24 DIAGNOSIS — Z79899 Other long term (current) drug therapy: Secondary | ICD-10-CM | POA: Diagnosis not present

## 2017-05-24 DIAGNOSIS — E785 Hyperlipidemia, unspecified: Secondary | ICD-10-CM | POA: Diagnosis not present

## 2017-05-24 DIAGNOSIS — G5700 Lesion of sciatic nerve, unspecified lower limb: Secondary | ICD-10-CM | POA: Diagnosis not present

## 2017-07-04 DIAGNOSIS — H5213 Myopia, bilateral: Secondary | ICD-10-CM | POA: Diagnosis not present

## 2017-07-04 DIAGNOSIS — H524 Presbyopia: Secondary | ICD-10-CM | POA: Diagnosis not present

## 2017-07-04 DIAGNOSIS — H35371 Puckering of macula, right eye: Secondary | ICD-10-CM | POA: Diagnosis not present

## 2017-07-04 DIAGNOSIS — H52203 Unspecified astigmatism, bilateral: Secondary | ICD-10-CM | POA: Diagnosis not present

## 2017-07-04 DIAGNOSIS — H11152 Pinguecula, left eye: Secondary | ICD-10-CM | POA: Diagnosis not present

## 2017-07-04 DIAGNOSIS — H2513 Age-related nuclear cataract, bilateral: Secondary | ICD-10-CM | POA: Diagnosis not present

## 2017-12-02 DIAGNOSIS — D1800 Hemangioma unspecified site: Secondary | ICD-10-CM | POA: Diagnosis not present

## 2017-12-02 DIAGNOSIS — Z85828 Personal history of other malignant neoplasm of skin: Secondary | ICD-10-CM | POA: Diagnosis not present

## 2017-12-02 DIAGNOSIS — L821 Other seborrheic keratosis: Secondary | ICD-10-CM | POA: Diagnosis not present

## 2017-12-02 DIAGNOSIS — D229 Melanocytic nevi, unspecified: Secondary | ICD-10-CM | POA: Diagnosis not present

## 2017-12-02 DIAGNOSIS — Z1283 Encounter for screening for malignant neoplasm of skin: Secondary | ICD-10-CM | POA: Diagnosis not present

## 2018-02-25 IMAGING — CR DG WRIST COMPLETE 3+V*L*
4 series · 4 of 4 positions shown · non-contrast
Comparison: None.

CLINICAL DATA: Fell with left wrist pain

EXAM:
LEFT WRIST - COMPLETE 3+ VIEW

[x wrist lat left]
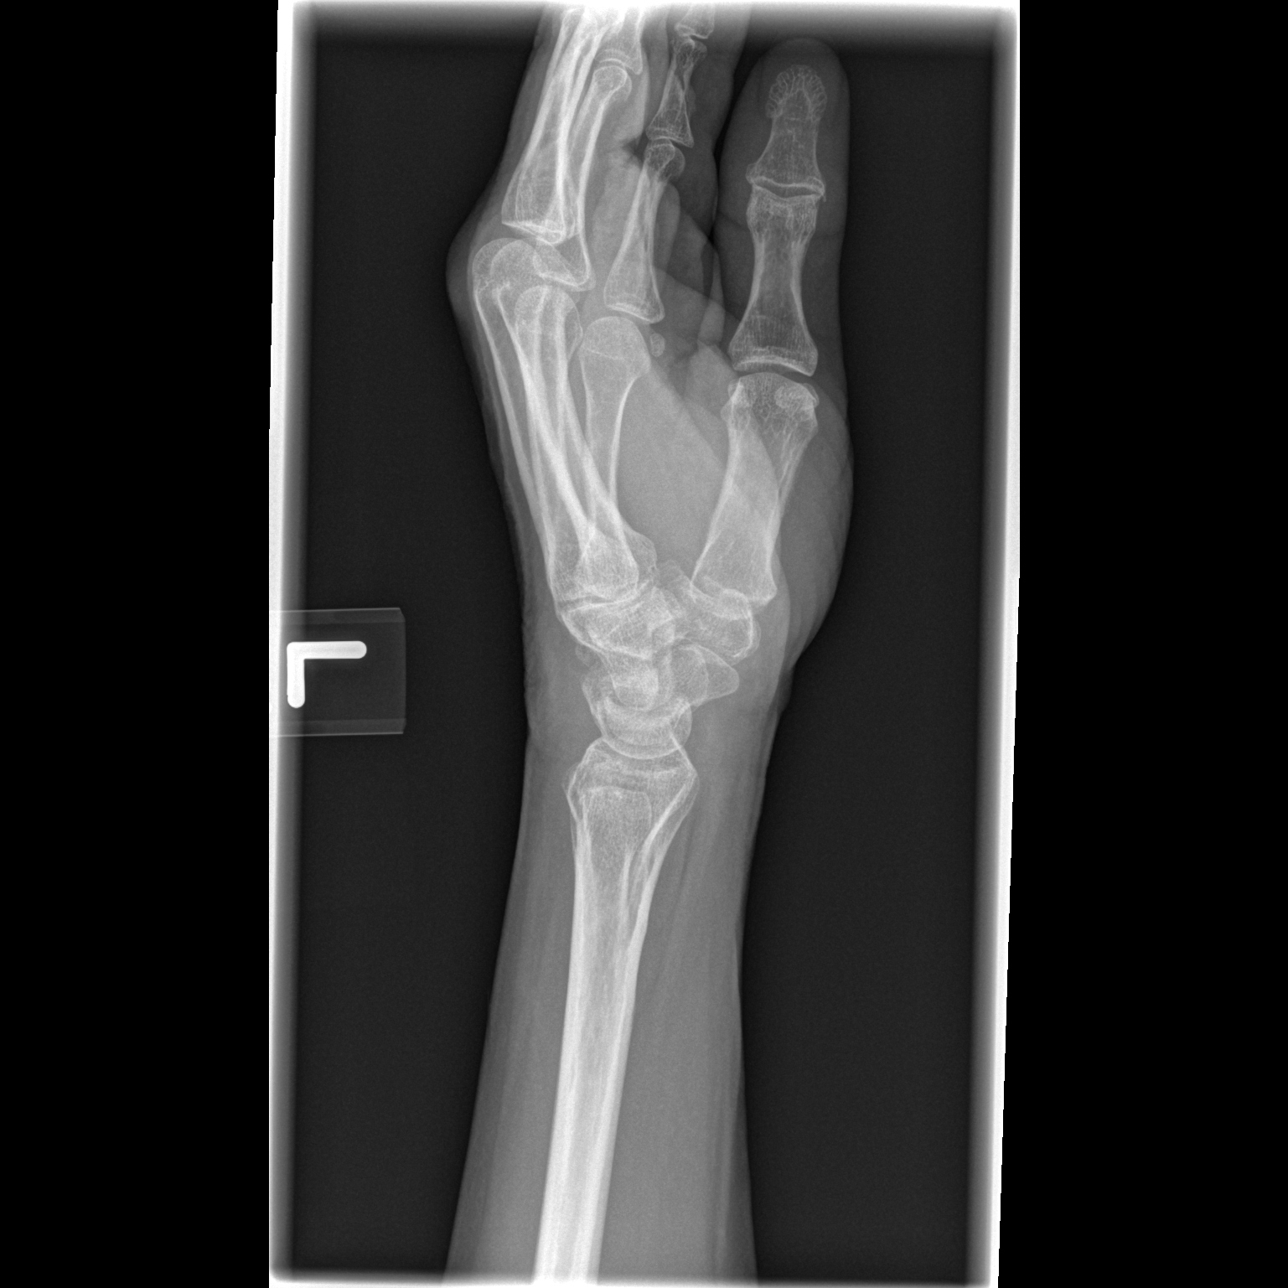

[x wrist pa left]
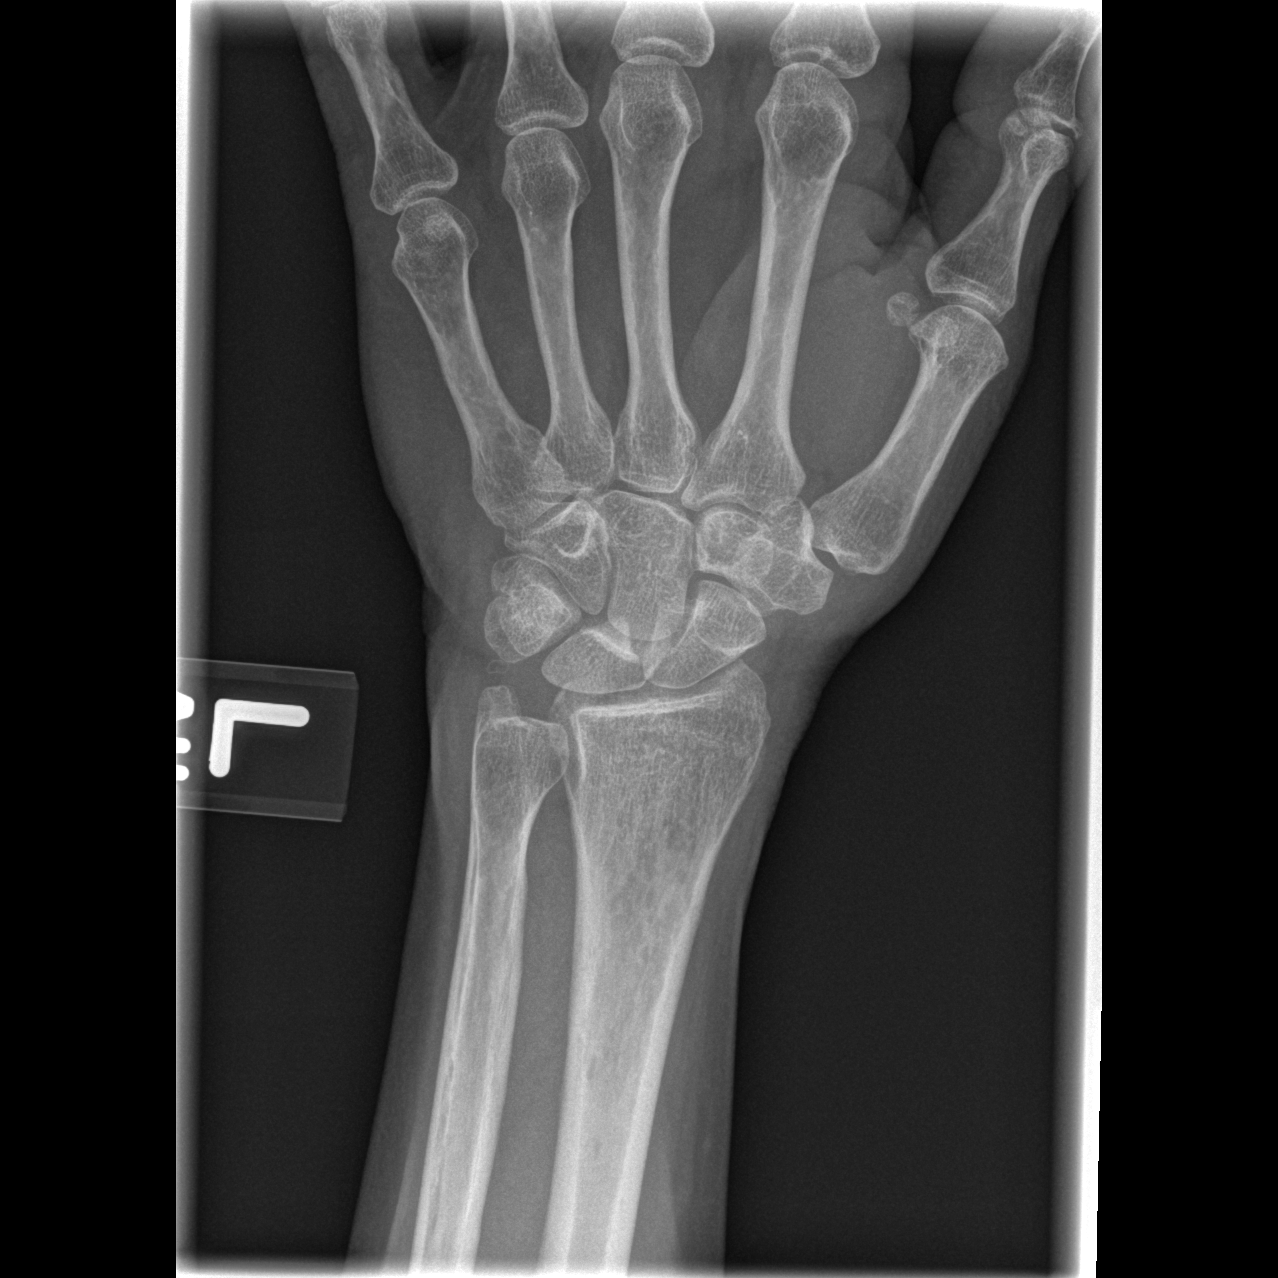

[x wrist obl left]
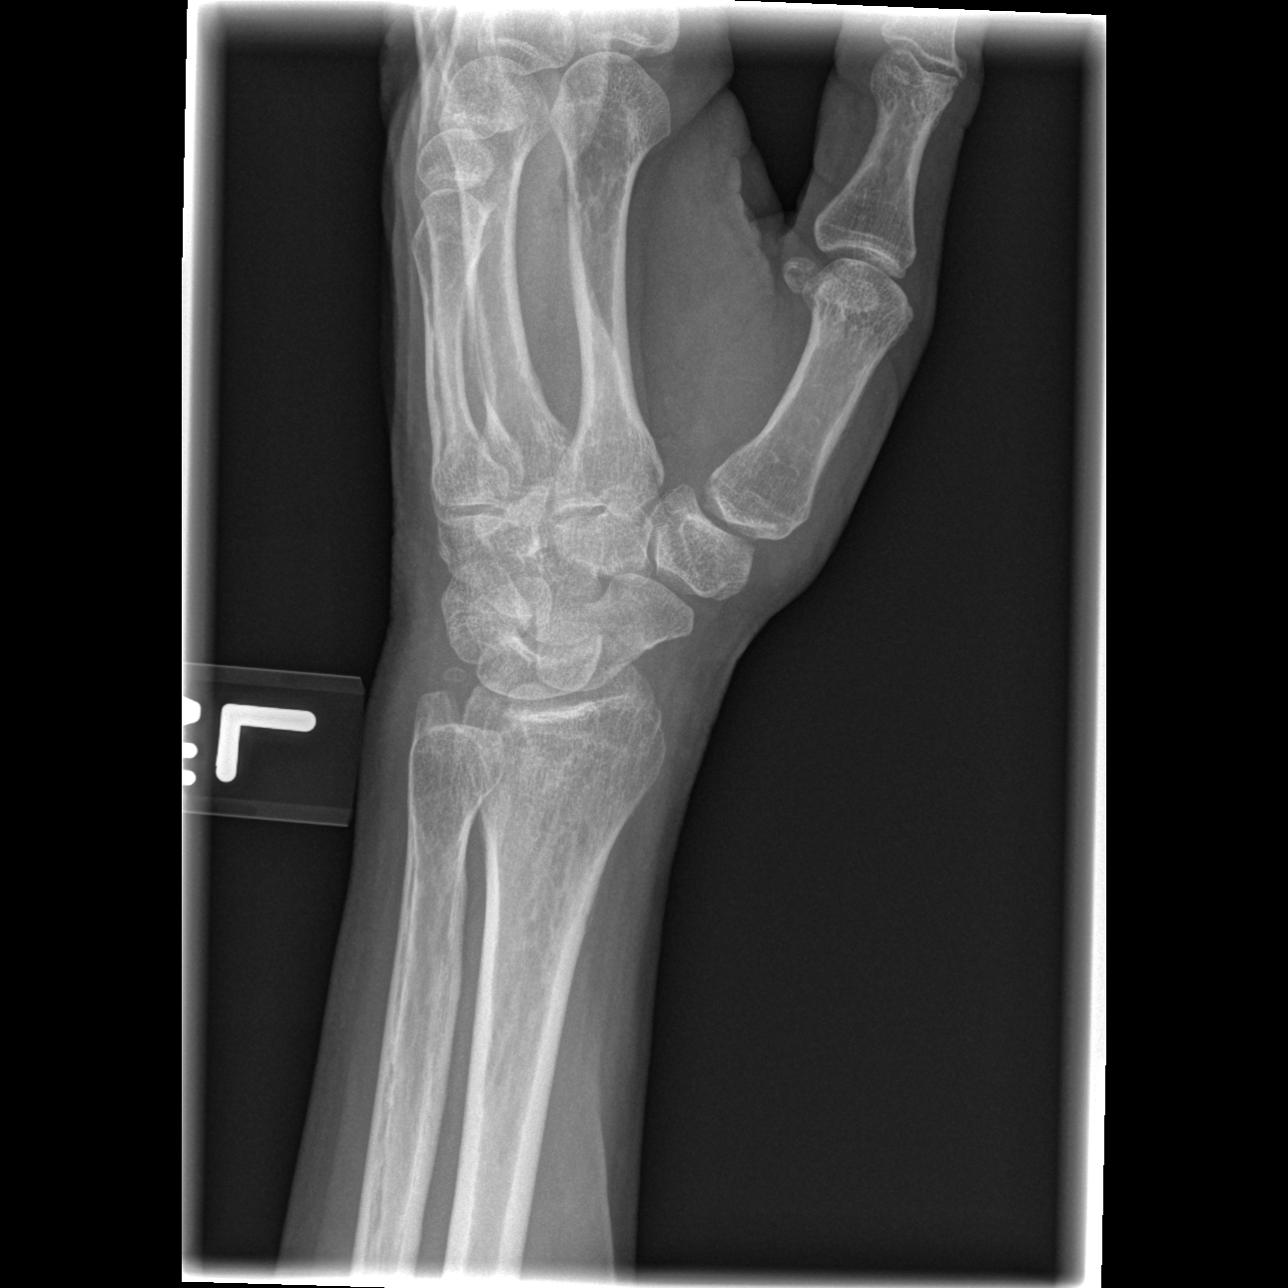

[x navicular]
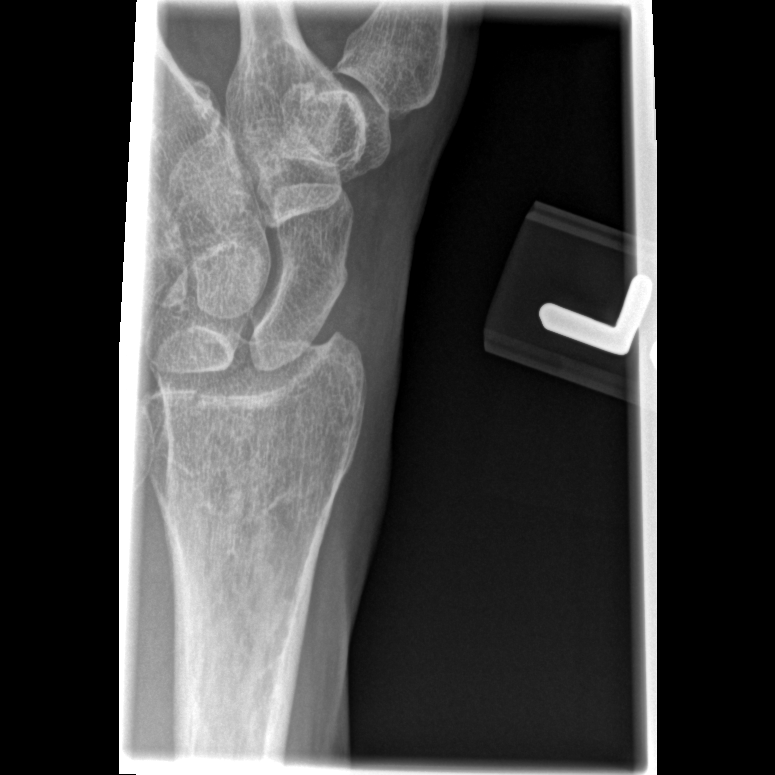

[4 of 4 positions shown; findings below may reference images not displayed]

FINDINGS: There does appear to be a subtle linear lucency within the distal
left radius extending to the radiocarpal joint space consistent with
nondisplaced fracture. This linear lucency is only seen on one view.
The distal left ulna is intact. Carpal bones are in normal position.
IMPRESSION: There does appear to be a subtle linear fracture of the distal left
radius involving the radiocarpal joint space on one view.

## 2018-07-04 DIAGNOSIS — Z79899 Other long term (current) drug therapy: Secondary | ICD-10-CM | POA: Diagnosis not present

## 2018-07-04 DIAGNOSIS — E785 Hyperlipidemia, unspecified: Secondary | ICD-10-CM | POA: Diagnosis not present

## 2018-07-04 DIAGNOSIS — G5700 Lesion of sciatic nerve, unspecified lower limb: Secondary | ICD-10-CM | POA: Diagnosis not present

## 2018-07-04 DIAGNOSIS — B356 Tinea cruris: Secondary | ICD-10-CM | POA: Diagnosis not present

## 2018-07-04 DIAGNOSIS — M7700 Medial epicondylitis, unspecified elbow: Secondary | ICD-10-CM | POA: Diagnosis not present

## 2018-07-04 DIAGNOSIS — Z0000 Encounter for general adult medical examination without abnormal findings: Secondary | ICD-10-CM | POA: Diagnosis not present

## 2018-07-14 DIAGNOSIS — Z780 Asymptomatic menopausal state: Secondary | ICD-10-CM | POA: Diagnosis not present

## 2018-07-14 DIAGNOSIS — Z1231 Encounter for screening mammogram for malignant neoplasm of breast: Secondary | ICD-10-CM | POA: Diagnosis not present

## 2018-07-16 DIAGNOSIS — H11152 Pinguecula, left eye: Secondary | ICD-10-CM | POA: Diagnosis not present

## 2018-07-16 DIAGNOSIS — H52201 Unspecified astigmatism, right eye: Secondary | ICD-10-CM | POA: Diagnosis not present

## 2018-07-16 DIAGNOSIS — H2513 Age-related nuclear cataract, bilateral: Secondary | ICD-10-CM | POA: Diagnosis not present

## 2018-07-16 DIAGNOSIS — H524 Presbyopia: Secondary | ICD-10-CM | POA: Diagnosis not present

## 2018-07-16 DIAGNOSIS — H5213 Myopia, bilateral: Secondary | ICD-10-CM | POA: Diagnosis not present

## 2018-07-28 DIAGNOSIS — Z1159 Encounter for screening for other viral diseases: Secondary | ICD-10-CM | POA: Diagnosis not present

## 2018-08-01 DIAGNOSIS — K5730 Diverticulosis of large intestine without perforation or abscess without bleeding: Secondary | ICD-10-CM | POA: Diagnosis not present

## 2018-08-01 DIAGNOSIS — Q438 Other specified congenital malformations of intestine: Secondary | ICD-10-CM | POA: Diagnosis not present

## 2018-08-01 DIAGNOSIS — Z1211 Encounter for screening for malignant neoplasm of colon: Secondary | ICD-10-CM | POA: Diagnosis not present

## 2018-08-01 DIAGNOSIS — Z800 Family history of malignant neoplasm of digestive organs: Secondary | ICD-10-CM | POA: Diagnosis not present

## 2018-10-18 ENCOUNTER — Other Ambulatory Visit: Payer: Self-pay

## 2018-10-18 ENCOUNTER — Inpatient Hospital Stay (HOSPITAL_COMMUNITY)
Admission: EM | Admit: 2018-10-18 | Discharge: 2018-10-21 | DRG: 470 | Disposition: A | Discharge status: Home Health | Payer: Medicare HMO | Attending: Internal Medicine | Admitting: Internal Medicine

## 2018-10-18 ENCOUNTER — Emergency Department (HOSPITAL_COMMUNITY): Payer: Medicare HMO

## 2018-10-18 ENCOUNTER — Encounter (HOSPITAL_COMMUNITY): Payer: Self-pay

## 2018-10-18 DIAGNOSIS — Z881 Allergy status to other antibiotic agents status: Secondary | ICD-10-CM | POA: Diagnosis not present

## 2018-10-18 DIAGNOSIS — Z90710 Acquired absence of both cervix and uterus: Secondary | ICD-10-CM | POA: Diagnosis not present

## 2018-10-18 DIAGNOSIS — Y92009 Unspecified place in unspecified non-institutional (private) residence as the place of occurrence of the external cause: Secondary | ICD-10-CM | POA: Diagnosis not present

## 2018-10-18 DIAGNOSIS — Y9301 Activity, walking, marching and hiking: Secondary | ICD-10-CM | POA: Diagnosis present

## 2018-10-18 DIAGNOSIS — S72001A Fracture of unspecified part of neck of right femur, initial encounter for closed fracture: Secondary | ICD-10-CM | POA: Diagnosis present

## 2018-10-18 DIAGNOSIS — S72009A Fracture of unspecified part of neck of unspecified femur, initial encounter for closed fracture: Secondary | ICD-10-CM | POA: Diagnosis present

## 2018-10-18 DIAGNOSIS — Z79899 Other long term (current) drug therapy: Secondary | ICD-10-CM | POA: Diagnosis not present

## 2018-10-18 DIAGNOSIS — D62 Acute posthemorrhagic anemia: Secondary | ICD-10-CM | POA: Diagnosis not present

## 2018-10-18 DIAGNOSIS — W010XXA Fall on same level from slipping, tripping and stumbling without subsequent striking against object, initial encounter: Secondary | ICD-10-CM | POA: Diagnosis present

## 2018-10-18 DIAGNOSIS — Z419 Encounter for procedure for purposes other than remedying health state, unspecified: Secondary | ICD-10-CM

## 2018-10-18 DIAGNOSIS — Z96649 Presence of unspecified artificial hip joint: Secondary | ICD-10-CM

## 2018-10-18 DIAGNOSIS — Z96641 Presence of right artificial hip joint: Secondary | ICD-10-CM | POA: Diagnosis not present

## 2018-10-18 DIAGNOSIS — Z23 Encounter for immunization: Secondary | ICD-10-CM

## 2018-10-18 DIAGNOSIS — M80051A Age-related osteoporosis with current pathological fracture, right femur, initial encounter for fracture: Secondary | ICD-10-CM | POA: Diagnosis not present

## 2018-10-18 DIAGNOSIS — Z9889 Other specified postprocedural states: Secondary | ICD-10-CM | POA: Diagnosis not present

## 2018-10-18 DIAGNOSIS — M810 Age-related osteoporosis without current pathological fracture: Secondary | ICD-10-CM | POA: Diagnosis present

## 2018-10-18 DIAGNOSIS — Z20828 Contact with and (suspected) exposure to other viral communicable diseases: Secondary | ICD-10-CM | POA: Diagnosis present

## 2018-10-18 LAB — CBC WITH DIFFERENTIAL/PLATELET
Abs Immature Granulocytes: 0.06 10*3/uL (ref 0.00–0.07)
Basophils Absolute: 0 10*3/uL (ref 0.0–0.1)
Basophils Relative: 0 %
Eosinophils Absolute: 0 10*3/uL (ref 0.0–0.5)
Eosinophils Relative: 0 %
HCT: 37 % (ref 36.0–46.0)
Hemoglobin: 12.4 g/dL (ref 12.0–15.0)
Immature Granulocytes: 0 %
Lymphocytes Relative: 8 %
Lymphs Abs: 1.1 10*3/uL (ref 0.7–4.0)
MCH: 31.6 pg (ref 26.0–34.0)
MCHC: 33.5 g/dL (ref 30.0–36.0)
MCV: 94.4 fL (ref 80.0–100.0)
Monocytes Absolute: 0.7 10*3/uL (ref 0.1–1.0)
Monocytes Relative: 5 %
Neutro Abs: 11.7 10*3/uL — ABNORMAL HIGH (ref 1.7–7.7)
Neutrophils Relative %: 87 %
Platelets: 274 10*3/uL (ref 150–400)
RBC: 3.92 MIL/uL (ref 3.87–5.11)
RDW: 13.9 % (ref 11.5–15.5)
WBC: 13.6 10*3/uL — ABNORMAL HIGH (ref 4.0–10.5)
nRBC: 0 % (ref 0.0–0.2)

## 2018-10-18 LAB — PROTIME-INR
INR: 1 (ref 0.8–1.2)
Prothrombin Time: 13.1 seconds (ref 11.4–15.2)

## 2018-10-18 LAB — BASIC METABOLIC PANEL
Anion gap: 10 (ref 5–15)
BUN: 10 mg/dL (ref 8–23)
CO2: 26 mmol/L (ref 22–32)
Calcium: 8.7 mg/dL — ABNORMAL LOW (ref 8.9–10.3)
Chloride: 102 mmol/L (ref 98–111)
Creatinine, Ser: 0.65 mg/dL (ref 0.44–1.00)
GFR calc Af Amer: >60 mL/min (ref >60)
GFR calc non Af Amer: >60 mL/min (ref >60)
Glucose, Bld: 158 mg/dL — ABNORMAL HIGH (ref 70–99)
Potassium: 3.8 mmol/L (ref 3.5–5.1)
Sodium: 138 mmol/L (ref 135–145)

## 2018-10-18 LAB — ABO/RH: ABO/RH(D): A POS

## 2018-10-18 LAB — SARS CORONAVIRUS 2 BY RT PCR (HOSPITAL ORDER, PERFORMED IN ~~LOC~~ HOSPITAL LAB): SARS Coronavirus 2: NEGATIVE

## 2018-10-18 MED ORDER — RAMELTEON 8 MG PO TABS
8.0000 mg | ORAL_TABLET | Freq: Once | ORAL | Status: AC
  Administered 2018-10-18: 8 mg via ORAL
  Filled 2018-10-18: qty 1

## 2018-10-18 MED ORDER — TRANEXAMIC ACID-NACL 1000-0.7 MG/100ML-% IV SOLN: 1000.0000 mg | INTRAVENOUS | Status: DC

## 2018-10-18 MED ORDER — ONDANSETRON HCL 4 MG/2ML IJ SOLN
4.0000 mg | Freq: Once | INTRAMUSCULAR | Status: AC
  Administered 2018-10-18: 4 mg via INTRAVENOUS
  Filled 2018-10-18: qty 2

## 2018-10-18 MED ORDER — ESTROGENS CONJUGATED 0.3 MG PO TABS: 0.3000 mg | ORAL_TABLET | Freq: Every day | ORAL | Status: DC

## 2018-10-18 MED ORDER — ESTROGENS CONJUGATED 0.3 MG PO TABS
0.3000 mg | ORAL_TABLET | Freq: Every day | ORAL | Status: DC
  Administered 2018-10-20 – 2018-10-21 (×2): 0.3 mg via ORAL
  Filled 2018-10-18 (×2): qty 1

## 2018-10-18 MED ORDER — HYDROMORPHONE HCL 1 MG/ML IJ SOLN: 0.5000 mg | Freq: Four times a day (QID) | INTRAMUSCULAR | Status: DC | PRN

## 2018-10-18 MED ORDER — FENTANYL CITRATE (PF) 100 MCG/2ML IJ SOLN
50.0000 ug | INTRAMUSCULAR | Status: DC | PRN
  Administered 2018-10-18: 50 ug via INTRAVENOUS
  Filled 2018-10-18: qty 2

## 2018-10-18 MED ORDER — ENSURE PRE-SURGERY PO LIQD
296.0000 mL | Freq: Once | ORAL | Status: AC
  Administered 2018-10-18: 296 mL via ORAL
  Filled 2018-10-18: qty 296

## 2018-10-18 MED ORDER — TRANEXAMIC ACID 1000 MG/10ML IV SOLN
2000.0000 mg | INTRAVENOUS | Status: DC
  Filled 2018-10-18: qty 20

## 2018-10-18 MED ORDER — POVIDONE-IODINE 10 % EX SWAB: 2.0000 "application " | Freq: Once | CUTANEOUS | Status: DC

## 2018-10-18 MED ORDER — CEFAZOLIN SODIUM-DEXTROSE 2-4 GM/100ML-% IV SOLN
2.0000 g | INTRAVENOUS | Status: AC
  Administered 2018-10-19: 2 g via INTRAVENOUS
  Filled 2018-10-18: qty 100

## 2018-10-18 MED ORDER — HYDROMORPHONE HCL 1 MG/ML IJ SOLN
0.5000 mg | Freq: Four times a day (QID) | INTRAMUSCULAR | Status: DC | PRN
  Administered 2018-10-18 – 2018-10-19 (×2): 0.5 mg via INTRAVENOUS
  Filled 2018-10-18 (×2): qty 1

## 2018-10-18 MED ORDER — SODIUM CHLORIDE 0.9 % IV SOLN
INTRAVENOUS | Status: AC
  Administered 2018-10-18: 21:00:00 via INTRAVENOUS

## 2018-10-18 NOTE — Progress Notes (Signed)
I spoke with Dr. Maryan Rued about the patient.  I plan to take her to surgery tomorrow morning.  Orders have been placed for NPO after midnight.  Please hold lovenox for surgery in the AM.  I will see patient for full consult.    Azucena Cecil, MD [facility] [phone] 5:12 PM

## 2018-10-18 NOTE — Consult Note (Addendum)
ORTHOPAEDIC CONSULTATION  REQUESTING PHYSICIAN: Oda Kilts, MD  Chief Complaint: Right femoral neck fracture  HPI: Carolyn [name] is a 68 y.o. female who presents with right femoral neck fracture s/p mechanical fall at home in which she tripped over her feet.  She hit her head but CT head is negative.  The patient endorses severe pain in the right hip, that does not radiate, grinding in quality, worse with any movement, better with immobilization.  Denies LOC/fever/chills/nausea/vomiting.  Walks without assistive devices (walker, cane, wheelchair).  Lives with husband and is very active and fully independent with ADLs.  Denies LOC, neck pain, abd pain.  Past Medical History:  Diagnosis Date  . Arthritis    neck  . Complication of anesthesia   . Osteoporosis   . PONV (postoperative nausea and vomiting)   . Thumb pain, left    Past Surgical History:  Procedure Laterality Date  . ABDOMINAL HYSTERECTOMY    . CESAREAN SECTION    . OOPHORECTOMY    . TENDON TRANSFER Left 02/07/2016   Procedure: LEFT THUMB TENDON TRANSFER;  Surgeon: Milly Jakob, MD;  Location: [facility];  Service: Orthopedics;  Laterality: Left;   Social History   Socioeconomic History  . Marital status: Married    Spouse name: Not on file  . Number of children: Not on file  . Years of education: Not on file  . Highest education level: Not on file  Occupational History  . Not on file  Social Needs  . Financial resource strain: Not on file  . Food insecurity    Worry: Not on file    Inability: Not on file  . Transportation needs    Medical: Not on file    Non-medical: Not on file  Tobacco Use  . Smoking status: Never Smoker  . Smokeless tobacco: Never Used  Substance and Sexual Activity  . Alcohol use: Yes    Alcohol/week: 10.0 standard drinks    Types: 10 Glasses of wine per week    Comment: social  . Drug use: No  . Sexual activity: Not on file  Lifestyle  . Physical  activity    Days per week: Not on file    Minutes per session: Not on file  . Stress: Not on file  Relationships  . Social Herbalist on phone: Not on file    Gets together: Not on file    Attends religious service: Not on file    Active member of club or organization: Not on file    Attends meetings of clubs or organizations: Not on file    Relationship status: Not on file  Other Topics Concern  . Not on file  Social History Narrative  . Not on file   History reviewed. No pertinent family history. Allergies  Allergen Reactions  . Sulfa Antibiotics Swelling   Prior to Admission medications   Medication Sig Start Date End Date Taking? Authorizing Provider  acetaminophen (TYLENOL) 325 MG tablet Take 2 tablets (650 mg total) by mouth every 6 (six) hours as needed for mild pain or moderate pain. 02/07/16   Milly Jakob, MD  Calcium Carb-Cholecalciferol (CALCIUM 1000 + D PO) Take by mouth.    [provider]  estrogens, conjugated, (PREMARIN) 0.3 MG tablet Take 0.3 mg by mouth daily. Take daily for 21 days then do not take for 7 days.    [provider]  ibuprofen (ADVIL) 200 MG tablet Take 3 tablets (600  mg total) by mouth every 6 (six) hours as needed for moderate pain. 02/07/16   Milly Jakob, MD  ondansetron (ZOFRAN) 4 MG tablet Take 1 tablet (4 mg total) by mouth every 8 (eight) hours as needed for nausea or vomiting. 02/07/16   Milly Jakob, MD  oxyCODONE (ROXICODONE) 5 MG immediate release tablet Take 1 tablet (5 mg total) by mouth every 6 (six) hours as needed for breakthrough pain. 02/07/16   Milly Jakob, MD   Dg Chest 1 View  Result Date: 10/18/2018 CLINICAL DATA:  Surgical clearance. EXAM: CHEST  1 VIEW COMPARISON:  None. FINDINGS: The heart size and mediastinal contours are within normal limits. Both lungs are clear. The visualized skeletal structures are unremarkable. IMPRESSION: No active disease. Electronically Signed   By: Marijo Conception M.D.   On: 10/18/2018 16:16   Ct Head Wo Contrast  Result Date: 10/18/2018 CLINICAL DATA:  Pain after fall EXAM: CT HEAD WITHOUT CONTRAST TECHNIQUE: Contiguous axial images were obtained from the base of the skull through the vertex without intravenous contrast. COMPARISON:  None. FINDINGS: Brain: No evidence of acute infarction, hemorrhage, hydrocephalus, extra-axial collection or mass lesion/mass effect. Vascular: No hyperdense vessel or unexpected calcification. Skull: Normal. Negative for fracture or focal lesion. Sinuses/Orbits: No acute finding. Other: None. IMPRESSION: No acute intracranial abnormalities. Electronically Signed   By: Dorise Bullion III M.D   On: 10/18/2018 16:25   Dg Hip Unilat With Pelvis 2-3 Views Right  Result Date: 10/18/2018 CLINICAL DATA:  Right hip pain after fall. EXAM: DG HIP (WITH OR WITHOUT PELVIS) 2-3V RIGHT COMPARISON:  None. FINDINGS: Mildly displaced fracture is seen involving the proximal right femoral neck. Femoral head remains situated within the acetabulum. IMPRESSION: Mildly displaced proximal right femoral neck fracture. Electronically Signed   By: Marijo Conception M.D.   On: 10/18/2018 16:18    All pertinent xrays, MRI, CT independently reviewed and interpreted  Positive ROS: All other systems have been reviewed and were otherwise negative with the exception of those mentioned in the HPI and as above.  Physical Exam: General: Alert, no acute distress Cardiovascular: No pedal edema Respiratory: No cyanosis, no use of accessory musculature GI: No organomegaly, abdomen is soft and non-tender Skin: No lesions in the area of chief complaint Neurologic: Sensation intact distally Psychiatric: Patient is competent for consent with normal mood and affect Lymphatic: No axillary or cervical lymphadenopathy  MUSCULOSKELETAL:  - severe pain with movement of the hip and extremity - skin intact - NVI distally - compartments soft  Assessment: Right  femoral neck fracture  Plan: - total hip replacement is recommended due to activity level, osteoporosis making cannulated screws worrisome for poor fixation, patient and family are aware of r/b/a and wish to proceed, informed consent obtained - medical optimization per primary team - surgery is planned for tomorrow morning - NPO after midnight - foley catheter  - hold lovenox for surgery in the morning  Thank you for the consult and the opportunity to see Ms. Schweppe  N. Eduard Roux, MD 8:50 PM

## 2018-10-18 NOTE — ED Notes (Signed)
ED TO INPATIENT HANDOFF REPORT  ED Nurse Name and Phone #: [phone]  S Name/Age/Gender Carolyn [name] 68 y.o. female Room/Bed: 025C/025C  Code Status   Code Status: Full Code  Home/SNF/Other Home Patient oriented to: self, place, time and situation Is this baseline? Yes   Triage Complete: Triage complete  Chief Complaint hip pain  Triage Note Pt fell at home injuring right hip. BIB [facility] with pelvic binding. Pt hit head but denies LOC and memory loss. Neck collar on. Hx of osteoporosis, no blood thinners. Pain 5 out of 10. 100 mcg Fentanyl given by EMS.    Allergies Allergies  Allergen Reactions  . Sulfa Antibiotics Swelling    Vaginal swelling from suppository    Level of Care/Admitting Diagnosis ED Disposition    ED Disposition Condition [facility]: [facility] [100100]  Level of Care: Med-Surg [16]  Covid Evaluation: N/A  Diagnosis: Hip fracture [facility]) [id number]  Admitting Physician: Oda Kilts [id number]  Attending Physician: Oda Kilts [id number]  Estimated length of stay: 3 - 4 days  Certification:: I certify this patient will need inpatient services for at least 2 midnights  PT Class (Do Not Modify): Inpatient [101]  PT Acc Code (Do Not Modify): Private [1]       B Medical/Surgery History Past Medical History:  Diagnosis Date  . Arthritis    neck  . Complication of anesthesia   . Osteoporosis   . PONV (postoperative nausea and vomiting)   . Thumb pain, left    Past Surgical History:  Procedure Laterality Date  . ABDOMINAL HYSTERECTOMY    . CESAREAN SECTION    . OOPHORECTOMY    . TENDON TRANSFER Left 02/07/2016   Procedure: LEFT THUMB TENDON TRANSFER;  Surgeon: Milly Jakob, MD;  Location: [facility];  Service: Orthopedics;  Laterality: Left;     A IV Location/Drains/Wounds Patient Lines/Drains/Airways Status   Active Line/Drains/Airways    Name:   Placement date:    Placement time:   Site:   Days:   Peripheral IV 10/18/18 Left   10/18/18    1919    -   less than 1   Incision (Closed) 02/07/16 Arm Left   02/07/16    1508     984          Intake/Output Last 24 hours No intake or output data in the 24 hours ending 10/18/18 2106  Labs/Imaging Results for orders placed or performed during the hospital encounter of 10/18/18 (from the past 48 hour(s))  Basic metabolic panel     Status: Abnormal   Collection Time: 10/18/18  5:00 PM  Result Value Ref Range   Sodium 138 135 - 145 mmol/L   Potassium 3.8 3.5 - 5.1 mmol/L   Chloride 102 98 - 111 mmol/L   CO2 26 22 - 32 mmol/L   Glucose, Bld 158 (H) 70 - 99 mg/dL   BUN 10 8 - 23 mg/dL   Creatinine, Ser 0.65 0.44 - 1.00 mg/dL   Calcium 8.7 (L) 8.9 - 10.3 mg/dL   GFR calc non Af Amer >60 >60 mL/min   GFR calc Af Amer >60 >60 mL/min   Anion gap 10 5 - 15    Comment: Performed at [facility], [place] [address], [place], [place] [address]  CBC WITH DIFFERENTIAL     Status: Abnormal   Collection Time: 10/18/18  5:00 PM  Result Value Ref Range  WBC 13.6 (H) 4.0 - 10.5 K/uL   RBC 3.92 3.87 - 5.11 MIL/uL   Hemoglobin 12.4 12.0 - 15.0 g/dL   HCT 37.0 36.0 - 46.0 %   MCV 94.4 80.0 - 100.0 fL   MCH 31.6 26.0 - 34.0 pg   MCHC 33.5 30.0 - 36.0 g/dL   RDW 13.9 11.5 - 15.5 %   Platelets 274 150 - 400 K/uL   nRBC 0.0 0.0 - 0.2 %   Neutrophils Relative % 87 %   Neutro Abs 11.7 (H) 1.7 - 7.7 K/uL   Lymphocytes Relative 8 %   Lymphs Abs 1.1 0.7 - 4.0 K/uL   Monocytes Relative 5 %   Monocytes Absolute 0.7 0.1 - 1.0 K/uL   Eosinophils Relative 0 %   Eosinophils Absolute 0.0 0.0 - 0.5 K/uL   Basophils Relative 0 %   Basophils Absolute 0.0 0.0 - 0.1 K/uL   Immature Granulocytes 0 %   Abs Immature Granulocytes 0.06 0.00 - 0.07 K/uL    Comment: Performed at [facility] [address], [place], [place] [address]  Protime-INR     Status: None   Collection Time: 10/18/18  5:00 PM  Result Value Ref  Range   Prothrombin Time 13.1 11.4 - 15.2 seconds   INR 1.0 0.8 - 1.2    Comment: (NOTE) INR goal varies based on device and disease states. Performed at [facility], [place] [address] [place], [place] [address]   Type and screen [place]     Status: None   Collection Time: 10/18/18  5:00 PM  Result Value Ref Range   ABO/RH(D) A POS    Antibody Screen NEG    Sample Expiration      10/21/2018,2359 Performed at [facility], [place] [address] [place], [place] [address]   ABO/Rh     Status: None   Collection Time: 10/18/18  5:00 PM  Result Value Ref Range   ABO/RH(D)      A POS Performed at [facility] [address] [place], [place] [address]   SARS Coronavirus 2 [facility] order, Performed in [facility] hospital lab) Nasopharyngeal Nasopharyngeal Swab     Status: None   Collection Time: 10/18/18  6:39 PM   Specimen: Nasopharyngeal Swab  Result Value Ref Range   SARS Coronavirus 2 NEGATIVE NEGATIVE    Comment: (NOTE) If result is NEGATIVE SARS-CoV-2 target nucleic acids are NOT DETECTED. The SARS-CoV-2 RNA is generally detectable in upper and lower  respiratory specimens during the acute phase of infection. The lowest  concentration of SARS-CoV-2 viral copies this assay can detect is 250  copies / mL. A negative result does not preclude SARS-CoV-2 infection  and should not be used as the sole basis for treatment or other  patient management decisions.  A negative result may occur with  improper specimen collection / handling, submission of specimen other  than nasopharyngeal swab, presence of viral mutation(s) within the  areas targeted by this assay, and inadequate number of viral copies  (<250 copies / mL). A negative result must be combined with clinical  observations, patient history, and epidemiological information. If result is POSITIVE SARS-CoV-2 target nucleic acids are DETECTED. The SARS-CoV-2 RNA is generally detectable in  upper and lower  respiratory specimens dur ing the acute phase of infection.  Positive  results are indicative of active infection with SARS-CoV-2.  Clinical  correlation with patient history and other diagnostic information  is  necessary to determine patient infection status.  Positive results do  not rule out bacterial infection or co-infection with other viruses. If result is PRESUMPTIVE POSTIVE SARS-CoV-2 nucleic acids MAY BE PRESENT.   A presumptive positive result was obtained on the submitted specimen  and confirmed on repeat testing.  While 2019 novel coronavirus  (SARS-CoV-2) nucleic acids may be present in the submitted sample  additional confirmatory testing may be necessary for epidemiological  and / or clinical management purposes  to differentiate between  SARS-CoV-2 and other Sarbecovirus currently known to infect humans.  If clinically indicated additional testing with an alternate test  methodology [phone]) is advised. The SARS-CoV-2 RNA is generally  detectable in upper and lower respiratory sp ecimens during the acute  phase of infection. The expected result is Negative. Fact Sheet for Patients:  StrictlyIdeas.no Fact Sheet for Healthcare Providers: BankingDealers.co.za This test is not yet approved or cleared by the [place] FDA and has been authorized for detection and/or diagnosis of SARS-CoV-2 by FDA under an Emergency Use Authorization (EUA).  This EUA will remain in effect (meaning this test can be used) for the duration of the COVID-19 declaration under Section 564(b)(1) of the Act, 21 U.S.C. section 360bbb-3(b)(1), unless the authorization is terminated or revoked sooner. Performed at [facility], [place] [address] [place], [place] [address]    Dg Chest 1 View  Result Date: 10/18/2018 CLINICAL DATA:  Surgical clearance. EXAM: CHEST  1 VIEW COMPARISON:  None. FINDINGS: The heart size and  mediastinal contours are within normal limits. Both lungs are clear. The visualized skeletal structures are unremarkable. IMPRESSION: No active disease. Electronically Signed   By: Marijo Conception M.D.   On: 10/18/2018 16:16   Ct Head Wo Contrast  Result Date: 10/18/2018 CLINICAL DATA:  Pain after fall EXAM: CT HEAD WITHOUT CONTRAST TECHNIQUE: Contiguous axial images were obtained from the base of the skull through the vertex without intravenous contrast. COMPARISON:  None. FINDINGS: Brain: No evidence of acute infarction, hemorrhage, hydrocephalus, extra-axial collection or mass lesion/mass effect. Vascular: No hyperdense vessel or unexpected calcification. Skull: Normal. Negative for fracture or focal lesion. Sinuses/Orbits: No acute finding. Other: None. IMPRESSION: No acute intracranial abnormalities. Electronically Signed   By: Dorise Bullion III M.D   On: 10/18/2018 16:25   Dg Hip Unilat With Pelvis 2-3 Views Right  Result Date: 10/18/2018 CLINICAL DATA:  Right hip pain after fall. EXAM: DG HIP (WITH OR WITHOUT PELVIS) 2-3V RIGHT COMPARISON:  None. FINDINGS: Mildly displaced fracture is seen involving the proximal right femoral neck. Femoral head remains situated within the acetabulum. IMPRESSION: Mildly displaced proximal right femoral neck fracture. Electronically Signed   By: Marijo Conception M.D.   On: 10/18/2018 16:18    Pending Labs Unresulted Labs (From admission, onward)    Start     Ordered   10/19/18 [id number]  Basic metabolic panel  Tomorrow morning,   R     10/18/18 1958   10/19/18 0500  Protime-INR  Tomorrow morning,   R     10/18/18 1958   10/19/18 0500  APTT  Tomorrow morning,   R     10/18/18 1958   10/19/18 0500  CBC  Tomorrow morning,   R     10/18/18 2003   10/18/18 1956  HIV Antibody  (Routine Testing)  Once,   STAT     10/18/18 1958          Vitals/Pain Today's Vitals  10/18/18 1830 10/18/18 1900 10/18/18 1909 10/18/18 1920  BP: 119/68 127/68    Pulse: 93 (!)  102    Resp: 14 (!) 21    Temp:      TempSrc:      SpO2: 99% 100%    Weight:      Height:      PainSc:   8  8     Isolation Precautions No active isolations  Medications Medications  feeding supplement (ENSURE PRE-SURGERY) liquid 296 mL (has no administration in time range)  povidone-iodine 10 % swab 2 application (has no administration in time range)  ceFAZolin (ANCEF) IVPB 2g/100 mL premix (has no administration in time range)  tranexamic acid (CYKLOKAPRON) IVPB 1,000 mg (has no administration in time range)  tranexamic acid (CYKLOKAPRON) 2,000 mg in sodium chloride 0.9 % 50 mL Topical Application (has no administration in time range)  0.9 %  sodium chloride infusion ( Intravenous New Bag/Given 10/18/18 2047)  HYDROmorphone (DILAUDID) injection 0.5 mg (0.5 mg Intravenous Given 10/18/18 2047)  estrogens (conjugated) (PREMARIN) tablet 0.3 mg (has no administration in time range)  ramelteon (ROZEREM) tablet 8 mg (has no administration in time range)  ondansetron (ZOFRAN) injection 4 mg (4 mg Intravenous Given 10/18/18 1715)    Mobility walks Low fall risk   Focused Assessments Musculoskeletal   R Recommendations: See Admitting Provider Note  Report given to:   Additional Notes:

## 2018-10-18 NOTE — ED Notes (Signed)
Patient transported to X-ray 

## 2018-10-18 NOTE — ED Provider Notes (Signed)
[facility] EMERGENCY DEPARTMENT Provider Note   CSN: [id number] Arrival date & time: 10/18/18  1415     History   Chief Complaint No chief complaint on file.   HPI Carolyn [name] is a 68 y.o. female.     Patient is a 68 year old female with a past medical history of osteoporosis who presents today with a fall and ongoing right hip pain.  Patient states she was walking across the floor with her tennis shoes on when she tripped over her own feet and stumbled falling forward landing on her right hip.  Husband states when she was going down she hit her head on the side of the stove.  There was no loss of consciousness.  Since the fall she has not been able to stand or walk due to severe pain in her right hip.  The pain is a 7 out of 10 right now but that was after getting 100 mg of fentanyl with EMS.  She denies any numbness or weakness.  She has no neck pain, chest pain, shortness of breath, abdominal pain.  She denies any recent flulike illness.  The history is provided by the patient.    Past Medical History:  Diagnosis Date  . Arthritis    neck  . Complication of anesthesia   . Osteoporosis   . PONV (postoperative nausea and vomiting)   . Thumb pain, left     There are no active problems to display for this patient.   Past Surgical History:  Procedure Laterality Date  . ABDOMINAL HYSTERECTOMY    . CESAREAN SECTION    . OOPHORECTOMY    . TENDON TRANSFER Left 02/07/2016   Procedure: LEFT THUMB TENDON TRANSFER;  Surgeon: Milly Jakob, MD;  Location: [facility];  Service: Orthopedics;  Laterality: Left;     OB History   No obstetric history on file.      Home Medications    Prior to Admission medications   Medication Sig Start Date End Date Taking? Authorizing Provider  acetaminophen (TYLENOL) 325 MG tablet Take 2 tablets (650 mg total) by mouth every 6 (six) hours as needed for mild pain or moderate pain. 02/07/16   Milly Jakob, MD  Calcium Carb-Cholecalciferol (CALCIUM 1000 + D PO) Take by mouth.    [provider]  estrogens, conjugated, (PREMARIN) 0.3 MG tablet Take 0.3 mg by mouth daily. Take daily for 21 days then do not take for 7 days.    [provider]  ibuprofen (ADVIL) 200 MG tablet Take 3 tablets (600 mg total) by mouth every 6 (six) hours as needed for moderate pain. 02/07/16   Milly Jakob, MD  ondansetron (ZOFRAN) 4 MG tablet Take 1 tablet (4 mg total) by mouth every 8 (eight) hours as needed for nausea or vomiting. 02/07/16   Milly Jakob, MD  oxyCODONE (ROXICODONE) 5 MG immediate release tablet Take 1 tablet (5 mg total) by mouth every 6 (six) hours as needed for breakthrough pain. 02/07/16   Milly Jakob, MD    Family History History reviewed. No pertinent family history.  Social History Social History   Tobacco Use  . Smoking status: Never Smoker  . Smokeless tobacco: Never Used  Substance Use Topics  . Alcohol use: Yes    Alcohol/week: 10.0 standard drinks    Types: 10 Glasses of wine per week    Comment: social  . Drug use: No     Allergies   Sulfa antibiotics  Review of Systems Review of Systems  All other systems reviewed and are negative.    Physical Exam Updated Vital Signs BP 124/78 (BP Location: Right Arm)   Pulse 69   Temp 97.8 F (36.6 C) (Oral)   Resp 16   Ht 5\' 4"  (1.626 m)   Wt 59 kg   SpO2 99%   BMI 22.31 kg/m   Physical Exam Vitals signs and nursing note reviewed.  Constitutional:      General: She is not in acute distress.    Appearance: She is well-developed.  HENT:     Head: Normocephalic and atraumatic.  Eyes:     Conjunctiva/sclera: Conjunctivae normal.     Pupils: Pupils are equal, round, and reactive to light.  Neck:     Musculoskeletal: Normal range of motion and neck supple. Normal range of motion. No spinous process tenderness or muscular tenderness.  Cardiovascular:     Rate and Rhythm: Normal rate and  regular rhythm.     Pulses: Normal pulses.     Heart sounds: No murmur.  Pulmonary:     Effort: Pulmonary effort is normal. No respiratory distress.     Breath sounds: Normal breath sounds. No wheezing or rales.  Abdominal:     General: There is no distension.     Palpations: Abdomen is soft.     Tenderness: There is no abdominal tenderness. There is no guarding or rebound.  Musculoskeletal:     Right hip: She exhibits decreased range of motion and tenderness. She exhibits no deformity.     Comments: 2+ dp pulse  Skin:    General: Skin is warm and dry.     Findings: No erythema or rash.  Neurological:     General: No focal deficit present.     Mental Status: She is alert and oriented to person, place, and time. Mental status is at baseline.  Psychiatric:        Mood and Affect: Mood normal.        Behavior: Behavior normal.        Thought Content: Thought content normal.      ED Treatments / Results  Labs (all labs ordered are listed, but only abnormal results are displayed) Labs Reviewed  BASIC METABOLIC PANEL - Abnormal; Notable for the following components:      Result Value   Glucose, Bld 158 (*)    Calcium 8.7 (*)    All other components within normal limits  CBC WITH DIFFERENTIAL/PLATELET - Abnormal; Notable for the following components:   WBC 13.6 (*)    Neutro Abs 11.7 (*)    All other components within normal limits  SARS CORONAVIRUS 2 (HOSPITAL ORDER, [facility])  PROTIME-INR  TYPE AND SCREEN  ABO/RH    EKG EKG Interpretation  Date/Time:  Saturday October 18 2018 17:15:22 EDT Ventricular Rate:  101 PR Interval:    QRS Duration: 102 QT Interval:  382 QTC Calculation: 496 R Axis:   92 Text Interpretation:  Sinus tachycardia Probable left atrial enlargement Consider right ventricular hypertrophy Borderline prolonged QT interval No previous tracing Confirmed by Blanchie Dessert [phone]) on 10/18/2018 5:24:34 PM   Radiology  Dg Chest 1 View  Result Date: 10/18/2018 CLINICAL DATA:  Surgical clearance. EXAM: CHEST  1 VIEW COMPARISON:  None. FINDINGS: The heart size and mediastinal contours are within normal limits. Both lungs are clear. The visualized skeletal structures are unremarkable. IMPRESSION: No active disease. Electronically Signed   By: Jeneen Rinks  Murlean Caller M.D.   On: 10/18/2018 16:16   Ct Head Wo Contrast  Result Date: 10/18/2018 CLINICAL DATA:  Pain after fall EXAM: CT HEAD WITHOUT CONTRAST TECHNIQUE: Contiguous axial images were obtained from the base of the skull through the vertex without intravenous contrast. COMPARISON:  None. FINDINGS: Brain: No evidence of acute infarction, hemorrhage, hydrocephalus, extra-axial collection or mass lesion/mass effect. Vascular: No hyperdense vessel or unexpected calcification. Skull: Normal. Negative for fracture or focal lesion. Sinuses/Orbits: No acute finding. Other: None. IMPRESSION: No acute intracranial abnormalities. Electronically Signed   By: Dorise Bullion III M.D   On: 10/18/2018 16:25   Dg Hip Unilat With Pelvis 2-3 Views Right  Result Date: 10/18/2018 CLINICAL DATA:  Right hip pain after fall. EXAM: DG HIP (WITH OR WITHOUT PELVIS) 2-3V RIGHT COMPARISON:  None. FINDINGS: Mildly displaced fracture is seen involving the proximal right femoral neck. Femoral head remains situated within the acetabulum. IMPRESSION: Mildly displaced proximal right femoral neck fracture. Electronically Signed   By: Marijo Conception M.D.   On: 10/18/2018 16:18    Procedures Procedures (including critical care time)  Medications Ordered in ED Medications  fentaNYL (SUBLIMAZE) injection 50 mcg (has no administration in time range)  ondansetron (ZOFRAN) injection 4 mg (has no administration in time range)     Initial Impression / Assessment and Plan / ED Course  I have reviewed the triage vital signs and the nursing notes.  Pertinent labs & imaging results that were available  during my care of the patient were reviewed by me and considered in my medical decision making (see chart for details).        Patient is a 68 year old female presenting today after a fall at home.  Patient has injury to the right hip and difficulty moving due to pain.  No significant deformity but concern for pelvic versus hip fracture.  Patient takes no anticoagulation and otherwise is relatively healthy with only a history of osteoporosis.  Vital signs are all within normal limits.  Patient's pain is starting to return and she was given fentanyl PRN.  Hip fracture protocol initiated.   4:37 PM Head CT is negative, chest x-ray clear, right hip film is significant for a mildly displaced right femoral neck fracture.  Findings discussed with the patient.  Will consult orthopedics.  Patient's n.p.o. time was since 1230 today.    6:27 PM EKG with borderline prolonged QT and mild sinus tachycardia.  BMP without acute findings, CBC with mild leukocytosis is most likely acute phase reaction from the fracture.  PT/INR within normal limits.  Patient will be posted for surgery tomorrow and needs to be n.p.o. after midnight.  Will admit for further care.  Final Clinical Impressions(s) / ED Diagnoses   Final diagnoses:  Closed displaced fracture of right femoral neck [facility])    ED Discharge Orders    None       Blanchie Dessert, MD 10/18/18 1828

## 2018-10-18 NOTE — H&P (Signed)
Date: 10/18/2018               Patient Name:  Carolyn [name] MRN: [id number]  DOB: May 25, 1950 Age / Sex: 68 y.o., female   PCP: Geralynn Ochs, MD         Medical Service: Internal Medicine Teaching Service         Attending Physician: Dr. Rebeca Alert Raynaldo Opitz, MD    First Contact: Dr. Les Pou Pager: [phone]  Second Contact: Dr. Berline Lopes Pager: [phone]       After Hours (After 5p/  First Contact Pager: [phone]  weekends / holidays): Second Contact Pager: [phone]   Chief Complaint: "right hip pain"  History of Present Illness: Carolyn [name] is a pleasant 68 year old female with a PMHx notable for "borderline" osteoporosis who presented with right hip pain. She was in her usual state of health until this afternoon around 12:30PM when she had a mechanical fall. She reports that her foot got stuck in her rubber shoes and subsequently fell the ground. She denies any prior dizziness, headaches, chest pain, palpitation. She did hit her head on the stove but denies any LOC or memory loss. States that with the help of her husband, she was able to stand after the fall and lean against the counter. Pt has had R hip pain and limited mobility ever since this fall. Her functional status is quite active and does yoga, goes walking. She and her husband go to the gym about 3x/week. Currently she reports of back pain and hip pain, rated an 8/10 and described as "a hot poker". Reports "feeling of her heart galloping" but denies chest pain, headache, nausea, vomiting.    ED course: Pt was afebrile, with BP 124/78, HR 69, O2 99 on RA. R hip radiograph significant for a mildly displaced proximal right femoral neck fracture. CXR and CT head both negative. Labs significant for Na 138, K 3.8, bicarb 26, elevated Glc 158, BUN 10, Cr 0.65, low Ca at 8.7, WBC elevated 13.6 with elevated absolute neutrophil count 11.7, Hgb 12.4, Hct 37.0, plts 274. Normal PT and INR. Pt was given 155mcg fentanyl by EMS and  then 36mcg fentanyl and 4mg  ondansetron while in the ED. Orthopedics was consulted and internal medicine called for admission.   Meds:  Current Meds  Medication Sig  . acetaminophen (TYLENOL) 500 MG tablet Take 500 mg by mouth every 6 (six) hours as needed for headache (pain).  . Calcium Carb-Cholecalciferol (CALCIUM+D3) 600-800 MG-UNIT TABS Take 1 tablet by mouth every morning.  . Cholecalciferol (VITAMIN D3) 50 MCG (2000 UT) TABS Take 1 tablet by mouth every morning.  . estrogens, conjugated, (PREMARIN) 0.3 MG tablet Take 0.3 mg by mouth every morning.   . fluticasone (FLONASE) 50 MCG/ACT nasal spray Place 1 spray into both nostrils at bedtime as needed for allergies or rhinitis (seasonal allergies).  Marland Kitchen ibuprofen (ADVIL) 200 MG tablet Take 3 tablets (600 mg total) by mouth every 6 (six) hours as needed for moderate pain. (Patient taking differently: Take 200 mg by mouth every 6 (six) hours as needed for headache or moderate pain. )  . loratadine (CLARITIN) 10 MG tablet Take 10 mg by mouth daily as needed for allergies.  Marland Kitchen OVER THE COUNTER MEDICATION Place 1 drop into both eyes daily as needed (itching/seasonal allergies). Over the counter allergy eye drop   Allergies: Allergies as of 10/18/2018 - Review Complete 10/18/2018  Allergen Reaction Noted  . Sulfa antibiotics Swelling 10/18/2015   Past Medical  History:  Diagnosis Date  . Arthritis    neck  . Complication of anesthesia   . Osteoporosis   . PONV (postoperative nausea and vomiting)   . Thumb pain, left    Family History: Mother with osteoporosis who died of colon cancer. Father with HTN who died from suspected cardiac arrest. Paternal grandmother with diabetes.  Social History: No cigarette use, couple glasses of wine per night (1-2), denies any other substance abuse. 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 She lives with her husband. Moved from [place] after retiring to take help take  care of her grandchildren.   Review of Systems: Review of Systems  Constitutional: Negative for chills and fever.  HENT: Negative for sinus pain and sore throat.   Eyes: Negative for blurred vision.  Respiratory: Negative for cough and shortness of breath.   Cardiovascular: Negative for chest pain and palpitations.  Gastrointestinal: Negative for abdominal pain, nausea and vomiting.  Genitourinary: Negative for dysuria and frequency.  Musculoskeletal: Positive for back pain, falls and joint pain. Negative for neck pain.  Skin: Negative.   Neurological: Negative for dizziness, weakness and headaches.   Physical Exam: Blood pressure 127/68, pulse (!) 102, temperature 97.8 F (36.6 C), temperature source Oral, resp. rate (!) 21, height 5\' 4"  (1.626 m), weight 59 kg, SpO2 100 %. Physical Exam Vitals signs and nursing note reviewed.  Constitutional:      General: She is not in acute distress.    Appearance: She is normal weight.  HENT:     Head: Normocephalic and atraumatic.  Neck:     Musculoskeletal: Normal range of motion and neck supple.  Cardiovascular:     Rate and Rhythm: Regular rhythm. Tachycardia present.     Pulses:          Dorsalis pedis pulses are 2+ on the right side and 2+ on the left side.     Heart sounds: Normal heart sounds.  Pulmonary:     Effort: Pulmonary effort is normal. No respiratory distress.     Breath sounds: Normal breath sounds. No wheezing, rhonchi or rales.  Abdominal:     General: Abdomen is flat. Bowel sounds are normal. There is no distension.     Palpations: Abdomen is soft.     Tenderness: There is no abdominal tenderness.  Musculoskeletal:     Comments: Pain and tenderness at R hip. No overlying skin changes or deformity.   Skin:    General: Skin is warm and dry.     Findings: No abrasion, bruising or ecchymosis.  Neurological:     General: No focal deficit present.     Mental Status: She is alert and oriented to person, place, and  time.     Motor: Motor function is intact.  Psychiatric:        Mood and Affect: Mood normal.    EKG: personally reviewed my interpretation is normal rate, sinus rhythm, no ST segment elevations  CXR: personally reviewed my interpretation is no cardiomegaly, no pleural effusion or pulmonary edema, no osseous abnormalities.     Assessment & Plan by Problem: Mrs. Burningham is a 69 year old F with significant PMH of osteoporosis who presented with right hip pain after a fall and found to have a mildly displaced proximal R femoral neck fracture   Displaced fracture of R femoral neck Pt with hip fracture after mechanical fall with plan for orthopedic surgery in the AM. - NPO after midnight -  dilaudid 0.5mg  IV q6h PRN - f/u CBC, BMP, PT/INR and PTT in the AM - holding enoxaparin for surgery in AM  Osteoporosis - premarin 0.3mg  PO daily beginning after surgery   Diet - NPO at midnight Fluids - 151mL/hr of NS DVT ppx - SCDs CODE STATUS - FULL CODE   Dispo: Admit patient to Inpatient with expected length of stay greater than 2 midnights.  Signed: Ladona Horns, MD 10/18/2018, 9:01 PM  Pager: # [phone]

## 2018-10-18 NOTE — ED Triage Notes (Signed)
Pt fell at home injuring right hip. BIB [facility] with pelvic binding. Pt hit head but denies LOC and memory loss. Neck collar on. Hx of osteoporosis, no blood thinners. Pain 5 out of 10. 100 mcg Fentanyl given by EMS.

## 2018-10-19 ENCOUNTER — Inpatient Hospital Stay (HOSPITAL_COMMUNITY): Payer: Medicare HMO | Admitting: Anesthesiology

## 2018-10-19 ENCOUNTER — Encounter (HOSPITAL_COMMUNITY): Payer: Self-pay | Admitting: Orthopedic Surgery

## 2018-10-19 ENCOUNTER — Inpatient Hospital Stay (HOSPITAL_COMMUNITY): Payer: Medicare HMO

## 2018-10-19 ENCOUNTER — Encounter (HOSPITAL_COMMUNITY)
Admission: EM | Disposition: A | Discharge status: Home Health | Payer: Self-pay | Source: Home / Self Care | Attending: Internal Medicine

## 2018-10-19 DIAGNOSIS — Z96641 Presence of right artificial hip joint: Secondary | ICD-10-CM

## 2018-10-19 DIAGNOSIS — S72001A Fracture of unspecified part of neck of right femur, initial encounter for closed fracture: Principal | ICD-10-CM

## 2018-10-19 DIAGNOSIS — M80051A Age-related osteoporosis with current pathological fracture, right femur, initial encounter for fracture: Secondary | ICD-10-CM

## 2018-10-19 HISTORY — PX: TOTAL HIP ARTHROPLASTY: SHX124

## 2018-10-19 LAB — MRSA PCR SCREENING: MRSA by PCR: NEGATIVE

## 2018-10-19 LAB — PROTIME-INR
INR: 1 (ref 0.8–1.2)
Prothrombin Time: 13 seconds (ref 11.4–15.2)

## 2018-10-19 LAB — BASIC METABOLIC PANEL
Anion gap: 7 (ref 5–15)
BUN: 8 mg/dL (ref 8–23)
CO2: 26 mmol/L (ref 22–32)
Calcium: 8.1 mg/dL — ABNORMAL LOW (ref 8.9–10.3)
Chloride: 102 mmol/L (ref 98–111)
Creatinine, Ser: 0.61 mg/dL (ref 0.44–1.00)
GFR calc Af Amer: >60 mL/min (ref >60)
GFR calc non Af Amer: >60 mL/min (ref >60)
Glucose, Bld: 147 mg/dL — ABNORMAL HIGH (ref 70–99)
Potassium: 3.7 mmol/L (ref 3.5–5.1)
Sodium: 135 mmol/L (ref 135–145)

## 2018-10-19 LAB — CBC
HCT: 32.5 % — ABNORMAL LOW (ref 36.0–46.0)
Hemoglobin: 10.6 g/dL — ABNORMAL LOW (ref 12.0–15.0)
MCH: 30.6 pg (ref 26.0–34.0)
MCHC: 32.6 g/dL (ref 30.0–36.0)
MCV: 93.9 fL (ref 80.0–100.0)
Platelets: 240 10*3/uL (ref 150–400)
RBC: 3.46 MIL/uL — ABNORMAL LOW (ref 3.87–5.11)
RDW: 14.1 % (ref 11.5–15.5)
WBC: 8.2 10*3/uL (ref 4.0–10.5)
nRBC: 0 % (ref 0.0–0.2)

## 2018-10-19 LAB — HIV ANTIBODY (ROUTINE TESTING W REFLEX): HIV Screen 4th Generation wRfx: NONREACTIVE

## 2018-10-19 LAB — APTT: aPTT: 27 seconds (ref 24–36)

## 2018-10-19 SURGERY — ARTHROPLASTY, HIP, TOTAL, ANTERIOR APPROACH
Anesthesia: General | Site: Hip | Laterality: Right

## 2018-10-19 MED ORDER — FENTANYL CITRATE (PF) 100 MCG/2ML IJ SOLN
INTRAMUSCULAR | Status: AC
  Filled 2018-10-19: qty 2

## 2018-10-19 MED ORDER — OXYCODONE-ACETAMINOPHEN 5-325 MG PO TABS: 1.0000 | ORAL_TABLET | Freq: Three times a day (TID) | ORAL | 0 refills | Status: DC | PRN

## 2018-10-19 MED ORDER — CHLORHEXIDINE GLUCONATE CLOTH 2 % EX PADS
6.0000 | MEDICATED_PAD | Freq: Every day | CUTANEOUS | Status: DC
  Administered 2018-10-20 – 2018-10-21 (×2): 6 via TOPICAL

## 2018-10-19 MED ORDER — MAGNESIUM CITRATE PO SOLN
1.0000 | Freq: Once | ORAL | Status: DC | PRN
  Filled 2018-10-19: qty 296

## 2018-10-19 MED ORDER — INFLUENZA VAC A&B SA ADJ QUAD 0.5 ML IM PRSY
0.5000 mL | PREFILLED_SYRINGE | INTRAMUSCULAR | Status: AC
  Administered 2018-10-21: 0.5 mL via INTRAMUSCULAR
  Filled 2018-10-19: qty 0.5

## 2018-10-19 MED ORDER — LACTATED RINGERS IV SOLN
INTRAVENOUS | Status: DC
  Administered 2018-10-19 (×3): via INTRAVENOUS

## 2018-10-19 MED ORDER — ENOXAPARIN SODIUM 40 MG/0.4ML ~~LOC~~ SOLN: 40.0000 mg | SUBCUTANEOUS | Status: DC

## 2018-10-19 MED ORDER — PROPOFOL 10 MG/ML IV BOLUS
INTRAVENOUS | Status: AC
  Filled 2018-10-19: qty 20

## 2018-10-19 MED ORDER — LIDOCAINE 2% (20 MG/ML) 5 ML SYRINGE
INTRAMUSCULAR | Status: DC | PRN
  Administered 2018-10-19: 60 mg via INTRAVENOUS

## 2018-10-19 MED ORDER — DEXAMETHASONE SODIUM PHOSPHATE 10 MG/ML IJ SOLN
INTRAMUSCULAR | Status: DC | PRN
  Administered 2018-10-19: 10 mg via INTRAVENOUS

## 2018-10-19 MED ORDER — DOCUSATE SODIUM 100 MG PO CAPS
100.0000 mg | ORAL_CAPSULE | Freq: Two times a day (BID) | ORAL | Status: DC
  Administered 2018-10-20 – 2018-10-21 (×2): 100 mg via ORAL
  Filled 2018-10-19 (×4): qty 1

## 2018-10-19 MED ORDER — POLYETHYLENE GLYCOL 3350 17 G PO PACK
17.0000 g | PACK | Freq: Every day | ORAL | Status: DC | PRN
  Administered 2018-10-20: 17 g via ORAL
  Filled 2018-10-19: qty 1

## 2018-10-19 MED ORDER — METHOCARBAMOL 500 MG PO TABS
500.0000 mg | ORAL_TABLET | Freq: Four times a day (QID) | ORAL | Status: DC | PRN
  Administered 2018-10-19 – 2018-10-20 (×2): 500 mg via ORAL
  Filled 2018-10-19 (×3): qty 1

## 2018-10-19 MED ORDER — ASPIRIN EC 325 MG PO TBEC: 325.0000 mg | DELAYED_RELEASE_TABLET | Freq: Two times a day (BID) | ORAL | 0 refills | Status: DC

## 2018-10-19 MED ORDER — ASPIRIN EC 325 MG PO TBEC: 325.0000 mg | DELAYED_RELEASE_TABLET | Freq: Two times a day (BID) | ORAL | Status: DC

## 2018-10-19 MED ORDER — PROPOFOL 10 MG/ML IV BOLUS
INTRAVENOUS | Status: DC | PRN
  Administered 2018-10-19: 110 mg via INTRAVENOUS

## 2018-10-19 MED ORDER — MENTHOL 3 MG MT LOZG: 1.0000 | LOZENGE | OROMUCOSAL | Status: DC | PRN

## 2018-10-19 MED ORDER — METHOCARBAMOL 1000 MG/10ML IJ SOLN
500.0000 mg | Freq: Four times a day (QID) | INTRAVENOUS | Status: DC | PRN
  Filled 2018-10-19: qty 5

## 2018-10-19 MED ORDER — OXYCODONE HCL 5 MG/5ML PO SOLN: 5.0000 mg | Freq: Once | ORAL | Status: DC | PRN

## 2018-10-19 MED ORDER — CEFAZOLIN SODIUM-DEXTROSE 2-4 GM/100ML-% IV SOLN
2.0000 g | Freq: Four times a day (QID) | INTRAVENOUS | Status: AC
  Administered 2018-10-19 (×3): 2 g via INTRAVENOUS
  Filled 2018-10-19 (×2): qty 100

## 2018-10-19 MED ORDER — SORBITOL 70 % SOLN: 30.0000 mL | Freq: Every day | Status: DC | PRN

## 2018-10-19 MED ORDER — PHENYLEPHRINE 40 MCG/ML (10ML) SYRINGE FOR IV PUSH (FOR BLOOD PRESSURE SUPPORT)
PREFILLED_SYRINGE | INTRAVENOUS | Status: AC
  Filled 2018-10-19: qty 10

## 2018-10-19 MED ORDER — ENOXAPARIN SODIUM 40 MG/0.4ML ~~LOC~~ SOLN
40.0000 mg | SUBCUTANEOUS | Status: DC
  Administered 2018-10-20: 40 mg via SUBCUTANEOUS
  Filled 2018-10-19: qty 0.4

## 2018-10-19 MED ORDER — HYDROCODONE-ACETAMINOPHEN 7.5-325 MG PO TABS
1.0000 | ORAL_TABLET | ORAL | Status: DC | PRN
  Administered 2018-10-19: 2 via ORAL
  Administered 2018-10-20 (×2): 1 via ORAL
  Administered 2018-10-20: 2 via ORAL
  Administered 2018-10-21: 1 via ORAL
  Filled 2018-10-19 (×3): qty 1
  Filled 2018-10-19 (×3): qty 2

## 2018-10-19 MED ORDER — OXYCODONE HCL 5 MG PO TABS: 5.0000 mg | ORAL_TABLET | Freq: Once | ORAL | Status: DC | PRN

## 2018-10-19 MED ORDER — VANCOMYCIN HCL 1000 MG IV SOLR
INTRAVENOUS | Status: AC
  Filled 2018-10-19: qty 1000

## 2018-10-19 MED ORDER — FENTANYL CITRATE (PF) 100 MCG/2ML IJ SOLN
25.0000 ug | INTRAMUSCULAR | Status: DC | PRN
  Administered 2018-10-19: 25 ug via INTRAVENOUS

## 2018-10-19 MED ORDER — VANCOMYCIN HCL 1 G IV SOLR
INTRAVENOUS | Status: DC | PRN
  Administered 2018-10-19: 1000 mg via TOPICAL

## 2018-10-19 MED ORDER — PNEUMOCOCCAL VAC POLYVALENT 25 MCG/0.5ML IJ INJ
0.5000 mL | INJECTION | INTRAMUSCULAR | Status: AC
  Administered 2018-10-21: 0.5 mL via INTRAMUSCULAR
  Filled 2018-10-19: qty 0.5

## 2018-10-19 MED ORDER — ALUM & MAG HYDROXIDE-SIMETH 200-200-20 MG/5ML PO SUSP: 30.0000 mL | ORAL | Status: DC | PRN

## 2018-10-19 MED ORDER — PHENOL 1.4 % MT LIQD: 1.0000 | OROMUCOSAL | Status: DC | PRN

## 2018-10-19 MED ORDER — FENTANYL CITRATE (PF) 100 MCG/2ML IJ SOLN
INTRAMUSCULAR | Status: DC | PRN
  Administered 2018-10-19 (×2): 50 ug via INTRAVENOUS
  Administered 2018-10-19: 100 ug via INTRAVENOUS
  Administered 2018-10-19: 50 ug via INTRAVENOUS

## 2018-10-19 MED ORDER — ALBUMIN HUMAN 5 % IV SOLN
INTRAVENOUS | Status: DC | PRN
  Administered 2018-10-19: 09:00:00 via INTRAVENOUS

## 2018-10-19 MED ORDER — SODIUM CHLORIDE 0.9 % IV SOLN
INTRAVENOUS | Status: DC
  Administered 2018-10-19 – 2018-10-21 (×5): via INTRAVENOUS

## 2018-10-19 MED ORDER — ACETAMINOPHEN 325 MG PO TABS
325.0000 mg | ORAL_TABLET | Freq: Four times a day (QID) | ORAL | Status: DC | PRN
  Administered 2018-10-20 – 2018-10-21 (×2): 650 mg via ORAL
  Filled 2018-10-19 (×2): qty 2

## 2018-10-19 MED ORDER — ONDANSETRON HCL 4 MG/2ML IJ SOLN: 4.0000 mg | Freq: Four times a day (QID) | INTRAMUSCULAR | Status: DC | PRN

## 2018-10-19 MED ORDER — TRANEXAMIC ACID 1000 MG/10ML IV SOLN
INTRAVENOUS | Status: DC | PRN
  Administered 2018-10-19: 2000 mg via TOPICAL

## 2018-10-19 MED ORDER — SODIUM CHLORIDE 0.9 % IV SOLN
INTRAVENOUS | Status: DC | PRN
  Administered 2018-10-19: 25 ug/min via INTRAVENOUS

## 2018-10-19 MED ORDER — MORPHINE SULFATE (PF) 2 MG/ML IV SOLN: 1.0000 mg | INTRAVENOUS | Status: DC | PRN

## 2018-10-19 MED ORDER — HYDROCODONE-ACETAMINOPHEN 5-325 MG PO TABS
1.0000 | ORAL_TABLET | ORAL | Status: DC | PRN
  Administered 2018-10-19: 1 via ORAL
  Filled 2018-10-19: qty 1

## 2018-10-19 MED ORDER — SODIUM CHLORIDE 0.9 % IR SOLN
Status: DC | PRN
  Administered 2018-10-19: 3000 mL

## 2018-10-19 MED ORDER — CEFAZOLIN SODIUM-DEXTROSE 2-4 GM/100ML-% IV SOLN
INTRAVENOUS | Status: AC
  Filled 2018-10-19: qty 100

## 2018-10-19 MED ORDER — 0.9 % SODIUM CHLORIDE (POUR BTL) OPTIME
TOPICAL | Status: DC | PRN
  Administered 2018-10-19: 1000 mL

## 2018-10-19 MED ORDER — MIDAZOLAM HCL 2 MG/2ML IJ SOLN
INTRAMUSCULAR | Status: AC
  Filled 2018-10-19: qty 2

## 2018-10-19 MED ORDER — FENTANYL CITRATE (PF) 250 MCG/5ML IJ SOLN
INTRAMUSCULAR | Status: AC
  Filled 2018-10-19: qty 5

## 2018-10-19 MED ORDER — MIDAZOLAM HCL 5 MG/5ML IJ SOLN
INTRAMUSCULAR | Status: DC | PRN
  Administered 2018-10-19: 2 mg via INTRAVENOUS

## 2018-10-19 MED ORDER — EPHEDRINE SULFATE 50 MG/ML IJ SOLN
INTRAMUSCULAR | Status: DC | PRN
  Administered 2018-10-19 (×2): 5 mg via INTRAVENOUS

## 2018-10-19 MED ORDER — ROCURONIUM BROMIDE 50 MG/5ML IV SOSY
PREFILLED_SYRINGE | INTRAVENOUS | Status: DC | PRN
  Administered 2018-10-19: 20 mg via INTRAVENOUS
  Administered 2018-10-19: 40 mg via INTRAVENOUS
  Administered 2018-10-19 (×2): 20 mg via INTRAVENOUS

## 2018-10-19 MED ORDER — SUGAMMADEX SODIUM 200 MG/2ML IV SOLN
INTRAVENOUS | Status: DC | PRN
  Administered 2018-10-19: 118 mg via INTRAVENOUS

## 2018-10-19 MED ORDER — ONDANSETRON HCL 4 MG/2ML IJ SOLN
INTRAMUSCULAR | Status: DC | PRN
  Administered 2018-10-19: 4 mg via INTRAVENOUS

## 2018-10-19 MED ORDER — ONDANSETRON HCL 4 MG PO TABS: 4.0000 mg | ORAL_TABLET | Freq: Four times a day (QID) | ORAL | Status: DC | PRN

## 2018-10-19 MED ORDER — DIPHENHYDRAMINE HCL 50 MG/ML IJ SOLN
INTRAMUSCULAR | Status: DC | PRN
  Administered 2018-10-19: 12.5 mg via INTRAVENOUS

## 2018-10-19 MED ORDER — ACETAMINOPHEN 500 MG PO TABS
500.0000 mg | ORAL_TABLET | Freq: Four times a day (QID) | ORAL | Status: AC
  Administered 2018-10-19 – 2018-10-20 (×4): 500 mg via ORAL
  Filled 2018-10-19 (×4): qty 1

## 2018-10-19 SURGICAL SUPPLY — 57 items
BAG DECANTER FOR FLEXI CONT (MISCELLANEOUS) ×2 IMPLANT
CELLS DAT CNTRL 66122 CELL SVR (MISCELLANEOUS) IMPLANT
COVER PERINEAL POST (MISCELLANEOUS) ×2 IMPLANT
COVER SURGICAL LIGHT HANDLE (MISCELLANEOUS) ×2 IMPLANT
COVER WAND RF STERILE (DRAPES) ×2 IMPLANT
DRAPE C-ARM 42X72 X-RAY (DRAPES) ×2 IMPLANT
DRAPE POUCH INSTRU U-SHP 10X18 (DRAPES) ×2 IMPLANT
DRAPE STERI IOBAN 125X83 (DRAPES) ×2 IMPLANT
DRAPE U-SHAPE 47X51 STRL (DRAPES) ×4 IMPLANT
DRSG AQUACEL AG ADV 3.5X10 (GAUZE/BANDAGES/DRESSINGS) ×2 IMPLANT
DURAPREP 26ML APPLICATOR (WOUND CARE) ×4 IMPLANT
ELECT BLADE 4.0 EZ CLEAN MEGAD (MISCELLANEOUS) ×2
ELECT REM PT RETURN 9FT ADLT (ELECTROSURGICAL) ×2
ELECTRODE BLDE 4.0 EZ CLN MEGD (MISCELLANEOUS) ×1 IMPLANT
ELECTRODE REM PT RTRN 9FT ADLT (ELECTROSURGICAL) ×1 IMPLANT
GAUZE XEROFORM 5X9 LF (GAUZE/BANDAGES/DRESSINGS) ×2 IMPLANT
GLOVE BIOGEL PI IND STRL 7.0 (GLOVE) ×1 IMPLANT
GLOVE BIOGEL PI INDICATOR 7.0 (GLOVE) ×1
GLOVE ECLIPSE 7.0 STRL STRAW (GLOVE) ×4 IMPLANT
GLOVE SKINSENSE NS SZ7.5 (GLOVE) ×1
GLOVE SKINSENSE STRL SZ7.5 (GLOVE) ×1 IMPLANT
GLOVE SURG SYN 7.5  E (GLOVE) ×4
GLOVE SURG SYN 7.5 E (GLOVE) ×4 IMPLANT
GOWN STRL REIN XL XLG (GOWN DISPOSABLE) ×2 IMPLANT
GOWN STRL REUS W/ TWL LRG LVL3 (GOWN DISPOSABLE) IMPLANT
GOWN STRL REUS W/ TWL XL LVL3 (GOWN DISPOSABLE) ×1 IMPLANT
GOWN STRL REUS W/TWL LRG LVL3 (GOWN DISPOSABLE)
GOWN STRL REUS W/TWL XL LVL3 (GOWN DISPOSABLE) ×1
HANDPIECE INTERPULSE COAX TIP (DISPOSABLE) ×1
HEAD M SROM 36MM 2 (Hips) ×1 IMPLANT
HOOD PEEL AWAY FLYTE STAYCOOL (MISCELLANEOUS) ×4 IMPLANT
IV NS IRRIG 3000ML ARTHROMATIC (IV SOLUTION) ×2 IMPLANT
KIT BASIN OR (CUSTOM PROCEDURE TRAY) ×2 IMPLANT
LINER NEUTRAL 52X36MM PLUS 4 (Liner) ×2 IMPLANT
MARKER SKIN DUAL TIP RULER LAB (MISCELLANEOUS) ×2 IMPLANT
NEEDLE SPNL 18GX3.5 QUINCKE PK (NEEDLE) ×2 IMPLANT
PACK TOTAL JOINT (CUSTOM PROCEDURE TRAY) ×2 IMPLANT
PACK UNIVERSAL I (CUSTOM PROCEDURE TRAY) ×2 IMPLANT
PIN SECTOR W/GRIP ACE CUP 52MM (Hips) ×2 IMPLANT
RTRCTR WOUND ALEXIS 18CM MED (MISCELLANEOUS)
SAW OSC TIP CART 19.5X105X1.3 (SAW) ×2 IMPLANT
SCREW 6.5MMX25MM (Screw) ×2 IMPLANT
SET HNDPC FAN SPRY TIP SCT (DISPOSABLE) ×1 IMPLANT
SROM M HEAD 36MM 2 (Hips) ×2 IMPLANT
STAPLER VISISTAT 35W (STAPLE) IMPLANT
STEM CORAIL KA13 (Stem) ×2 IMPLANT
SUT ETHIBOND 2 V 37 (SUTURE) ×2 IMPLANT
SUT VIC AB 0 CT1 27 (SUTURE) ×1
SUT VIC AB 0 CT1 27XBRD ANBCTR (SUTURE) ×1 IMPLANT
SUT VIC AB 1 CTX 36 (SUTURE) ×1
SUT VIC AB 1 CTX36XBRD ANBCTR (SUTURE) ×1 IMPLANT
SUT VIC AB 2-0 CT1 27 (SUTURE) ×2
SUT VIC AB 2-0 CT1 TAPERPNT 27 (SUTURE) ×2 IMPLANT
SYR 50ML LL SCALE MARK (SYRINGE) ×2 IMPLANT
TOWEL GREEN STERILE (TOWEL DISPOSABLE) ×2 IMPLANT
TRAY CATH 16FR W/PLASTIC CATH (SET/KITS/TRAYS/PACK) IMPLANT
YANKAUER SUCT BULB TIP NO VENT (SUCTIONS) ×2 IMPLANT

## 2018-10-19 NOTE — Progress Notes (Signed)
RN called report to pre-op receiving RN Estill Bamberg.   RN called Central Telemetry to notify that patient is leaving the floor for surgery.   Patient is currently being transported to the OR.

## 2018-10-19 NOTE — Anesthesia Procedure Notes (Signed)
Procedure Name: Intubation Date/Time: 10/19/2018 7:32 AM Performed by: Lavell Luster, CRNA Pre-anesthesia Checklist: Patient identified, Emergency Drugs available, Suction available, Patient being monitored and Timeout performed Patient Re-evaluated:Patient Re-evaluated prior to induction Oxygen Delivery Method: Circle system utilized Preoxygenation: Pre-oxygenation with 100% oxygen Induction Type: IV induction Ventilation: Mask ventilation without difficulty Laryngoscope Size: Mac and 3 Grade View: Grade I Tube type: Oral Tube size: 7.0 mm Number of attempts: 1 Airway Equipment and Method: Stylet Placement Confirmation: ETT inserted through vocal cords under direct vision,  positive ETCO2 and breath sounds checked- equal and bilateral Secured at: 22 cm Tube secured with: Tape Dental Injury: Teeth and Oropharynx as per pre-operative assessment

## 2018-10-19 NOTE — Op Note (Signed)
TOTAL HIP ARTHROPLASTY ANTERIOR APPROACH  Procedure Note Carolyn [name]   [id number]  Pre-op Diagnosis: Right femoral neck fracture     Post-op Diagnosis: same   Operative Procedures  1. Total hip replacement; Right hip; uncemented [id number]   Personnel  Surgeon(s): Leandrew Koyanagi, MD  Assist: Madalyn Rob, PA-C; necessary for the timely completion of procedure and due to complexity of procedure.   Anesthesia: general  Prosthesis: Depuy Acetabulum: Pinnacle 52 mm Femur: Corail KA 13 Head: 36 mm size: -2 Liner: +4 neutral Bearing Type: metal on poly  Total Hip Arthroplasty (Anterior Approach) Op Note:  After informed consent was obtained and the operative extremity marked in the holding area, the patient was brought back to the operating room and placed supine on the HANA table. Next, the operative extremity was prepped and draped in normal sterile fashion. Surgical timeout occurred verifying patient identification, surgical site, surgical procedure and administration of antibiotics.  A modified anterior Smith-Peterson approach to the hip was performed, using the interval between tensor fascia lata and sartorius.  Dissection was carried bluntly down onto the anterior hip capsule. The lateral femoral circumflex vessels were identified and coagulated. A capsulotomy was performed and the capsular flaps tagged for later repair.  Fracture hematoma was encountered.  Fluoroscopy was utilized to prepare for the femoral neck cut. The neck osteotomy was performed. The femoral head was removed along with fracture fragments, the acetabular rim was cleared of soft tissue and attention was turned to reaming the acetabulum.  Sequential reaming was performed under fluoroscopic guidance. We reamed to a size 51 mm, and then impacted the acetabular shell. A 6.5 mm x 25 mm cancellous screw was placed for extra fixation of the cup.  The liner was then placed after irrigation and attention turned  to the femur.  After placing the femoral hook, the leg was taken to externally rotated, extended and adducted position taking care to perform soft tissue releases to allow for adequate mobilization of the femur. Soft tissue was cleared from the shoulder of the greater trochanter and the hook elevator used to improve exposure of the proximal femur. Sequential broaching performed up to a size 13. Trial neck and head were placed. The leg was brought back up to neutral and the construct reduced. The position and sizing of components, offset and leg lengths were checked using fluoroscopy. Stability of the construct was checked in extension and external rotation without any subluxation or impingement of prosthesis. We dislocated the prosthesis, dropped the leg back into position, removed trial components, and irrigated copiously. The final stem and head was then placed, the leg brought back up, the system reduced and fluoroscopy used to verify positioning.  We irrigated, obtained hemostasis and closed the capsule using #2 ethibond suture.  One gram of vancomycin powder was placed in the surgical bed. The fascia was closed with #1 vicryl plus, the deep fat layer was closed with 0 vicryl, the subcutaneous layers closed with 2.0 Vicryl Plus and the skin closed with 2.0 nylon. A sterile dressing was applied. The patient was awakened in the operating room and taken to recovery in stable condition.  All sponge, needle, and instrument counts were correct at the end of the case.   Position: supine  Complications: see description of procedure.  Time Out: performed   Drains/Packing: none  Estimated blood loss: see anesthesia record  Returned to Recovery Room: in good condition.   Antibiotics: yes   Mechanical VTE (DVT) Prophylaxis: sequential compression  devices, TED thigh-high  Chemical VTE (DVT) Prophylaxis: aspirin 325 mg BID  Fluid Replacement: see anesthesia record  Specimens Removed: 1 to pathology    Sponge and Instrument Count Correct? yes   PACU: portable radiograph - low AP   Plan/RTC: Return in 2 weeks for staple removal. Weight Bearing/Load Lower Extremity: full  Hip precautions: none Suture Removal: 2 weeks   N. Eduard Roux, MD [facility] 9:15 AM   Implant Name Type Inv. Item Serial No. Manufacturer Lot No. LRB No. Used Action  PIN SECTOR W/GRIP ACE CUP 52MM - [id number] Hips PIN SECTOR W/GRIP ACE CUP 52MM  DEPUY SYNTHES [id number] Right 1 Implanted  SCREW 6.5MMX25MM - [id number] Screw SCREW 6.5MMX25MM  DEPUY SYNTHES [id number] Right 1 Implanted  LINER NEUTRAL 52X36MM PLUS 4 - [id number] Liner LINER NEUTRAL 52X36MM PLUS 4  DEPUY SYNTHES [id number] Right 1 Implanted  SROM M HEAD 36MM 2 - [id number] Hips SROM M HEAD 36MM 2  DEPUY SYNTHES [id number] Right 1 Implanted  STEM CORAIL KA13 - [id number] Stem STEM Ronalee Belts  DEPUY SYNTHES [id number] Right 1 Implanted

## 2018-10-19 NOTE — Progress Notes (Signed)
  Date: 10/19/2018  Patient name: Carolyn [name]  Medical record number: [id number]  Date of birth: Sep 13, 1950   I have seen and evaluated this patient and I have discussed the plan of care with the house staff. Please see their note for complete details. I concur with their findings with the following additions/corrections:  Doing well post right hip replacement for displaced right femoral neck fracture. Previously diagnosed with osteopenia, now clearly classified as osteoporosis with a fragility fracture. Will recommend bisphosphonate going forward.  Lenice Pressman, M.D., Ph.D. 10/19/2018, 2:47 PM

## 2018-10-19 NOTE — Transfer of Care (Signed)
Immediate Anesthesia Transfer of Care Note  Patient: Carolyn [name]  Procedure(s) Performed: TOTAL HIP ARTHROPLASTY ANTERIOR APPROACH (Right Hip)  Patient Location: PACU  Anesthesia Type:General  Level of Consciousness: awake, alert , oriented and sedated  Airway & Oxygen Therapy: Patient Spontanous Breathing  Post-op Assessment: Post -op Vital signs reviewed and stable  Post vital signs: stable  Last Vitals:  Vitals Value Taken Time  BP 124/61 10/19/18 0938  Temp    Pulse 110 10/19/18 0940  Resp 13 10/19/18 0940  SpO2 100 % 10/19/18 0940  Vitals shown include unvalidated device data.  Last Pain:  Vitals:   10/19/18 0452  TempSrc:   PainSc: Asleep         Complications: No apparent anesthesia complications

## 2018-10-19 NOTE — Anesthesia Postprocedure Evaluation (Signed)
Anesthesia Post Note  Patient: Carolyn [name]  Procedure(s) Performed: TOTAL HIP ARTHROPLASTY ANTERIOR APPROACH (Right Hip)     Patient location during evaluation: PACU Anesthesia Type: General Level of consciousness: awake and alert Pain management: pain level controlled Vital Signs Assessment: post-procedure vital signs reviewed and stable Respiratory status: spontaneous breathing, nonlabored ventilation, respiratory function stable and patient connected to nasal cannula oxygen Cardiovascular status: blood pressure returned to baseline and stable Postop Assessment: no apparent nausea or vomiting Anesthetic complications: no    Last Vitals:  Vitals:   10/19/18 0953 10/19/18 1020  BP: 120/66 123/71  Pulse: (!) 102 (!) 103  Resp: 12 15  Temp: 36.8 C 36.8 C  SpO2: 100% 95%    Last Pain:  Vitals:   10/19/18 1020  TempSrc: Oral  PainSc:                  [place]

## 2018-10-19 NOTE — Discharge Instructions (Signed)

## 2018-10-19 NOTE — Progress Notes (Signed)
1015 Received pt from PACU, sleepy, oriented x4. Right hip incision with Aquacel dressing dry and intact. Denies pain at this time.

## 2018-10-19 NOTE — Anesthesia Preprocedure Evaluation (Addendum)
Anesthesia Evaluation  Patient identified by MRN, date of birth, ID band Patient awake    Reviewed: Allergy & Precautions, H&P , NPO status , Patient's Chart, lab work & pertinent test results  History of Anesthesia Complications (+) PONV and history of anesthetic complications  Airway Mallampati: II   Neck ROM: full    Dental  (+) Teeth Intact, Dental Advisory Given   Pulmonary neg pulmonary ROS,    breath sounds clear to auscultation       Cardiovascular negative cardio ROS   Rhythm:regular Rate:Normal     Neuro/Psych    GI/Hepatic   Endo/Other    Renal/GU      Musculoskeletal  (+) Arthritis ,   Abdominal   Peds  Hematology  (+) anemia ,   Anesthesia Other Findings   Reproductive/Obstetrics                            Anesthesia Physical Anesthesia Plan  ASA: II  Anesthesia Plan: General   Post-op Pain Management:    Induction: Intravenous  PONV Risk Score and Plan: 4 or greater and Ondansetron, Dexamethasone, Treatment may vary due to age or medical condition and Midazolam  Airway Management Planned: Oral ETT  Additional Equipment:   Intra-op Plan:   Post-operative Plan: Extubation in OR  Informed Consent: I have reviewed the patients History and Physical, chart, labs and discussed the procedure including the risks, benefits and alternatives for the proposed anesthesia with the patient or authorized representative who has indicated his/her understanding and acceptance.       Plan Discussed with: CRNA, Anesthesiologist and Surgeon  Anesthesia Plan Comments:         Anesthesia Quick Evaluation

## 2018-10-19 NOTE — Progress Notes (Addendum)
  Subjective:  Patient evaluated bedside this morning.  Patient states that she is doing well.  Reports some throat soreness after anesthesia.  Patient states she has some pain from the surgical site.  Encouraged patient to ask for pain medication.  Objective:   Vital Signs (last 24 hours): Vitals:   10/19/18 0646 10/19/18 0938 10/19/18 0953 10/19/18 1020  BP:  124/61 120/66 123/71  Pulse:  (!) 105 (!) 102 (!) 103  Resp:  12 12 15   Temp:  98.3 F (36.8 C) 98.2 F (36.8 C) 98.2 F (36.8 C)  TempSrc:    Oral  SpO2:  100% 100% 95%  Weight: 59 kg     Height: 5' 4.02" (1.626 m)       Physical Exam: General Alert and answers questions appropriately, no acute distress  Cardiac Regular rate and rhythm, no murmurs, rubs, or gallops  Pulmonary Clear to auscultation bilaterally without wheezes, rhonchi, or rales  Extremities No peripheral edema    Assessment/Plan:   Principal Problem:   Displaced fracture of right femoral neck (HCC) Active Problems:   Hip fracture [facility])  Patient is a 68 year old female with significant past medical history of osteoporosis presented with right hip pain after a fall and found to have a mildly displaced proximal right femoral neck fracture.  # Displaced fracture of right femoral neck: Patient had total hip arthroplasty performed this morning by Dr. Erlinda Hong with orthopedic surgery.  WBC initially elevated yesterday in setting of acute injury to 13.6.  WBC of 8.2 this morning.  Patient remains afebrile. *Hydrocodone-acetaminophen 5-325 mg, 1 to 2 tablets every 4 hours as needed for moderate pain *Hydrocodone-acetaminophen 7.5-325 mg, 1 to 2 tablets every 4 hours as needed for severe pain *Morphine 1 mg every 2 hour as needed for breakthrough pain after oral analgesic  # Osteoporosis:  - Premarin 0.3 mg PO daily beginning after surgery  Diet: Regular DVT Ppx: SCDs Dispo: Anticipated discharge in approximately 1-2 day(s).   Jeanmarie Hubert, MD 10/19/2018,  10:54 AM Pager: [phone]

## 2018-10-20 ENCOUNTER — Encounter (HOSPITAL_COMMUNITY): Payer: Self-pay | Admitting: Orthopaedic Surgery

## 2018-10-20 DIAGNOSIS — Z9889 Other specified postprocedural states: Secondary | ICD-10-CM

## 2018-10-20 DIAGNOSIS — D62 Acute posthemorrhagic anemia: Secondary | ICD-10-CM

## 2018-10-20 LAB — BASIC METABOLIC PANEL
Anion gap: 6 (ref 5–15)
BUN: 8 mg/dL (ref 8–23)
CO2: 27 mmol/L (ref 22–32)
Calcium: 7.7 mg/dL — ABNORMAL LOW (ref 8.9–10.3)
Chloride: 104 mmol/L (ref 98–111)
Creatinine, Ser: 0.59 mg/dL (ref 0.44–1.00)
GFR calc Af Amer: >60 mL/min (ref >60)
GFR calc non Af Amer: >60 mL/min (ref >60)
Glucose, Bld: 148 mg/dL — ABNORMAL HIGH (ref 70–99)
Potassium: 3.7 mmol/L (ref 3.5–5.1)
Sodium: 137 mmol/L (ref 135–145)

## 2018-10-20 LAB — CBC
HCT: 22 % — ABNORMAL LOW (ref 36.0–46.0)
Hemoglobin: 7.1 g/dL — ABNORMAL LOW (ref 12.0–15.0)
MCH: 31 pg (ref 26.0–34.0)
MCHC: 32.3 g/dL (ref 30.0–36.0)
MCV: 96.1 fL (ref 80.0–100.0)
Platelets: 160 10*3/uL (ref 150–400)
RBC: 2.29 MIL/uL — ABNORMAL LOW (ref 3.87–5.11)
RDW: 14.2 % (ref 11.5–15.5)
WBC: 9.2 10*3/uL (ref 4.0–10.5)
nRBC: 0 % (ref 0.0–0.2)

## 2018-10-20 LAB — HEMOGLOBIN AND HEMATOCRIT, BLOOD
HCT: 27.8 % — ABNORMAL LOW (ref 36.0–46.0)
Hemoglobin: 9.6 g/dL — ABNORMAL LOW (ref 12.0–15.0)

## 2018-10-20 LAB — PREPARE RBC (CROSSMATCH)

## 2018-10-20 MED ORDER — SODIUM CHLORIDE 0.9% IV SOLUTION: Freq: Once | INTRAVENOUS | Status: DC

## 2018-10-20 MED ORDER — ASPIRIN 325 MG PO TABS
325.0000 mg | ORAL_TABLET | Freq: Every day | ORAL | Status: DC
  Administered 2018-10-21: 325 mg via ORAL
  Filled 2018-10-20: qty 1

## 2018-10-20 NOTE — Evaluation (Signed)
Physical Therapy Evaluation Patient Details Name: Carolyn [name] MRN: [id number] DOB: April 22, 1950 Today's Date: 10/20/2018   History of Present Illness  68 year old female with significant past medical history of osteoporosis presented with right hip pain after a fall and found to have a mildly displaced proximal right femoral neck fracture, now s/p R anterior THA (WBAT).  Clinical Impression  Pt demonstrates deficits in strength, power, gait, functional mobility, and endurance. Pt's activity tolerance is limited by dizziness and nausea this session, however patient mobilizes with use of RW and limited physical assistance. Pt will benefit from continued acute PT services to progress toward her prior level of function. Recommendations: Home with outpatient PT and a RW.    Follow Up Recommendations Outpatient PT    Equipment Recommendations  Rolling walker with 5" wheels    Recommendations for Other Services       Precautions / Restrictions Precautions Precautions: Fall Restrictions Weight Bearing Restrictions: Yes RLE Weight Bearing: Weight bearing as tolerated      Mobility  Bed Mobility Overal bed mobility: Needs Assistance Bed Mobility: Supine to Sit     Supine to sit: Supervision        Transfers Overall transfer level: Needs assistance Equipment used: Rolling walker (2 wheeled) Transfers: Sit to/from Stand Sit to Stand: Supervision            Ambulation/Gait Ambulation/Gait assistance: Supervision Gait Distance (Feet): 5 Feet Assistive device: Rolling walker (2 wheeled) Gait Pattern/deviations: Step-to pattern Gait velocity: reduced Gait velocity interpretation: <1.31 ft/sec, indicative of household ambulator General Gait Details: pt side stepping toward chair  Stairs            Wheelchair Mobility    Modified Rankin (Stroke Patients Only)       Balance Overall balance assessment: Needs assistance Sitting-balance support: No upper  extremity supported;Feet supported Sitting balance-Leahy Scale: Good     Standing balance support: Single extremity supported Standing balance-Leahy Scale: Fair                               Pertinent Vitals/Pain Pain Assessment: 0-10 Pain Score: 6  Pain Location: RLE Pain Descriptors / Indicators: Aching Pain Intervention(s): Limited activity within patient's tolerance    Home Living Family/patient expects to be discharged to:: Private residence Living Arrangements: Spouse/significant other Available Help at Discharge: Family;Available 24 hours/day Type of Home: House Home Access: Stairs to enter Entrance Stairs-Rails: Left Entrance Stairs-Number of Steps: 1 Home Layout: One level Home Equipment: Shower seat;Hand held shower head;Grab bars - tub/shower      Prior Function Level of Independence: Independent         Comments: pt performing yoga multiple times per week     Hand Dominance        Extremity/Trunk Assessment   Upper Extremity Assessment Upper Extremity Assessment: Defer to OT evaluation    Lower Extremity Assessment Lower Extremity Assessment: RLE deficits/detail RLE Deficits / Details: Grossly 4-/5, ROM WFL    Cervical / Trunk Assessment Cervical / Trunk Assessment: Normal  Communication   Communication: No difficulties  Cognition Arousal/Alertness: Awake/alert Behavior During Therapy: WFL for tasks assessed/performed Overall Cognitive Status: Within Functional Limits for tasks assessed                                        General Comments  Exercises Total Joint Exercises Ankle Circles/Pumps: AROM;15 reps;Both Long Arc Quad: AROM;Both;15 reps   Assessment/Plan    PT Assessment Patient needs continued PT services  PT Problem List Decreased strength;Decreased activity tolerance;Decreased balance;Decreased knowledge of use of DME;Decreased safety awareness;Pain       PT Treatment Interventions DME  instruction;Gait training;Stair training;Functional mobility training;Therapeutic activities;Therapeutic exercise;Balance training;Neuromuscular re-education;Patient/family education    PT Goals (Current goals can be found in the Care Plan section)  Acute Rehab PT Goals Patient Stated Goal: To return to independence and prior level of function PT Goal Formulation: With patient Time For Goal Achievement: 11/03/18 Potential to Achieve Goals: Good    Frequency Min 5X/week   Barriers to discharge        Co-evaluation               AM-PAC PT "6 Clicks" Mobility  Outcome Measure Help needed turning from your back to your side while in a flat bed without using bedrails?: None Help needed moving from lying on your back to sitting on the side of a flat bed without using bedrails?: None Help needed moving to and from a bed to a chair (including a wheelchair)?: None Help needed standing up from a chair using your arms (e.g., wheelchair or bedside chair)?: None Help needed to walk in hospital room?: A Little Help needed climbing 3-5 steps with a railing? : A Little 6 Click Score: 22    End of Session Equipment Utilized During Treatment: Gait belt Activity Tolerance: Treatment limited secondary to medical complications (Comment)(nausea, dizziness) Patient left: in chair;with call bell/phone within reach;with family/visitor present Nurse Communication: Mobility status PT Visit Diagnosis: Other abnormalities of gait and mobility (R26.89)    Time: [id number] PT Time Calculation (min) (ACUTE ONLY): 35 min   Charges:   PT Evaluation $PT Eval Low Complexity: 1 Low PT Treatments $Therapeutic Activity: 8-22 mins        Zenaida Niece, PT, DPT Acute Rehabilitation Pager: [phone]   Zenaida Niece 10/20/2018, 12:49 PM

## 2018-10-20 NOTE — TOC Initial Note (Signed)
Transition of Care [facility]) - Initial/Assessment Note    Patient Details  Name: Carolyn [name] MRN: [id number] Date of Birth: 10-24-1950  [facility]) CM/SW Contact:    Rae Mar, RN Phone Number: 10/20/2018, 4:45 PM  Clinical Narrative:                  CM noted DME order for rolling walker. Contacted Zack with [employer] who will deliver rolling walker to bedside. Noted PT recommendation for outpt PT. Waiting for OT recommendation before getting appropriate therapy orders from provider.   Expected Discharge Plan: OP Rehab Barriers to Discharge: Continued Medical Work up   Patient Goals and CMS Choice     Choice offered to / list presented to : NA  Expected Discharge Plan and Services Expected Discharge Plan: OP Rehab   Discharge Planning Services: CM Consult                     DME Arranged: Walker rolling DME Agency: [employer] Date DME Agency Contacted: 10/20/18 Time DME Agency Contacted: [phone] Representative spoke with at DME Agency: [place]: NA          Prior Living Arrangements/Services                       Activities of Daily Living Home Assistive Devices/Equipment: None ADL Screening (condition at time of admission) Patient's cognitive ability adequate to safely complete daily activities?: Yes Is the patient deaf or have difficulty hearing?: No Does the patient have difficulty seeing, even when wearing glasses/contacts?: No Does the patient have difficulty concentrating, remembering, or making decisions?: No Patient able to express need for assistance with ADLs?: Yes Does the patient have difficulty dressing or bathing?: Yes Independently performs ADLs?: No Communication: Appropriate for developmental age 68 in Home: Independent Does the patient have difficulty walking or climbing stairs?: Yes Weakness of Legs: Right Weakness of Arms/Hands: None  Permission Sought/Granted                  Emotional  Assessment              Admission diagnosis:  Closed displaced fracture of right femoral neck ([place]) [S72.001A] Patient Active Problem List   Diagnosis Date Noted  . Displaced fracture of right femoral neck ([place]) 10/18/2018  . Hip fracture ([place]) 10/18/2018   PCP:  Geralynn Ochs, MD Pharmacy:   [facility] [address] [place], [place] - [address] [place] [address] Phone: [phone] Fax: [phone]     Social Determinants of Health (SDOH) Interventions    Readmission Risk Interventions No flowsheet data found.

## 2018-10-20 NOTE — Evaluation (Addendum)
Occupational Therapy Evaluation and Discharge Patient Details Name: Carolyn [name] MRN: [id number] DOB: Jul 16, 1950 Today's Date: 10/20/2018    History of Present Illness 68 year old female with significant past medical history of osteoporosis presented with right hip pain after a fall and found to have a mildly displaced proximal right femoral neck fracture, now s/p R anterior THA (WBAT).   Clinical Impression   This 68 yo female admitted and underwent above presents to acute OT with all OT education completed with pt and husband, acute OT will sign off.    Follow Up Recommendations  No OT follow up;Supervision - Intermittent    Equipment Recommendations  3 in 1 bedside commode       Precautions / Restrictions Precautions Precautions: Fall Restrictions Weight Bearing Restrictions: No RLE Weight Bearing: Weight bearing as tolerated      Mobility Bed Mobility Overal bed mobility: Needs Assistance Bed Mobility: Sit to Supine       Sit to supine: Min assist   General bed mobility comments: For RLE  Transfers Overall transfer level: Needs assistance Equipment used: Rolling walker (2 wheeled) Transfers: Sit to/from Stand Sit to Stand: Min guard         General transfer comment: Pt ambulated 50 feet with RW and minguard A    Balance Overall balance assessment: Needs assistance;No apparent balance deficits (not formally assessed) Sitting-balance support: Feet supported Sitting balance-Leahy Scale: Good     Standing balance support: No upper extremity supported;During functional activity Standing balance-Leahy Scale: Fair Standing balance comment: standing at sink to wash hands and brush teeth                           ADL either performed or assessed with clinical judgement   ADL Overall ADL's : Needs assistance/impaired Eating/Feeding: Independent   Grooming: Set up;Supervision/safety;Standing;Oral care;Wash/dry hands   Upper Body Bathing: Set  up;Sitting   Lower Body Bathing: Minimal assistance Lower Body Bathing Details (indicate cue type and reason): Min guard A sit<>stand Upper Body Dressing : Set up;Sitting   Lower Body Dressing: Moderate assistance Lower Body Dressing Details (indicate cue type and reason): Min guard A sit<>stand, we discussed the most efficient sequence of getting dressed Toilet Transfer: Min guard;Ambulation;RW;Comfort height toilet;Grab bars Toilet Transfer Details (indicate cue type and reason): and also BSC over toilet Toileting- Clothing Manipulation and Hygiene: Modified independent;Sitting/lateral lean     Tub/Shower Transfer Details (indicate cue type and reason): I demonstrated to husband and pt sequence of backing into shower stall to sit on seat and stepping out of shower stall         Vision Patient Visual Report: No change from baseline              Pertinent Vitals/Pain Pain Assessment: 0-10 Pain Score: 5  Pain Location: RLE--groin Pain Descriptors / Indicators: Aching;Sore Pain Intervention(s): Limited activity within patient's tolerance;Monitored during session;Repositioned;Ice applied     Hand Dominance Right   Extremity/Trunk Assessment Upper Extremity Assessment Upper Extremity Assessment: Overall WFL for tasks assessed           Communication Communication Communication: No difficulties   Cognition Arousal/Alertness: Awake/alert Behavior During Therapy: WFL for tasks assessed/performed Overall Cognitive Status: Within Functional Limits for tasks assessed                                        Exercises  Other Exercises Other Exercises: We talked about the handout that PT had left for exercises and how some of them are easier in bed (ie: straight leg raise)--pt was trying to do in recliner (not impossible but much harder) and how she could do some of them in chair as well as in standing.        Home Living Family/patient expects to be  discharged to:: Private residence Living Arrangements: Spouse/significant other Available Help at Discharge: Family;Available 24 hours/day Type of Home: House Home Access: Stairs to enter [employer] of Steps: 1 Entrance Stairs-Rails: Left Home Layout: One level     Bathroom Shower/Tub: Walk-in shower;Door   [employer] Toilet: Programmer, systems: Yes   Home Equipment: Shower seat;Hand held shower head;Grab bars - tub/shower          Prior Functioning/Environment Level of Independence: Independent        Comments: pt performing yoga multiple times per week        OT Problem List: Decreased range of motion;Impaired balance (sitting and/or standing);Pain         OT Goals(Current goals can be found in the care plan section) Acute Rehab OT Goals Patient Stated Goal: To get back to enjoying the grandkids  OT Frequency:                AM-PAC OT "6 Clicks" Daily Activity     Outcome Measure Help from another person eating meals?: None Help from another person taking care of personal grooming?: A Little Help from another person toileting, which includes using toliet, bedpan, or urinal?: A Little Help from another person bathing (including washing, rinsing, drying)?: A Little Help from another person to put on and taking off regular upper body clothing?: A Little Help from another person to put on and taking off regular lower body clothing?: A Lot 6 Click Score: 18   End of Session Equipment Utilized During Treatment: Gait belt;Rolling walker  Activity Tolerance: Patient tolerated treatment well Patient left: in bed;with call bell/phone within reach;with family/visitor present  OT Visit Diagnosis: Unsteadiness on feet (R26.81);Other abnormalities of gait and mobility (R26.89);Pain Pain - Right/Left: Right Pain - part of body: Leg                Time: 1346-1435 OT Time Calculation (min): 49 min Charges:  OT General Charges $OT Visit: 1  Visit OT Evaluation $OT Eval Moderate Complexity: 1 Mod OT Treatments $Self Care/Home Management : 23-37 mins  [place], OTR/L Acute [employer] Pager [phone] Office [phone]    Almon Register 10/20/2018, 4:43 PM

## 2018-10-20 NOTE — Progress Notes (Signed)
Subjective: 1 Day Post-Op Procedure(s) (LRB): TOTAL HIP ARTHROPLASTY ANTERIOR APPROACH (Right) Patient reports pain as mild.  No lightheadedness/dizziness, chest pain/sob.  Feeling well.   Objective: Vital signs in last 24 hours: Temp:  [98.2 F (36.8 C)-99.5 F (37.5 C)] 98.8 F (37.1 C) (09/28 0550) Pulse Rate:  [84-105] 87 (09/28 0550) Resp:  [12-18] 18 (09/28 0550) BP: (103-124)/(55-71) 103/58 (09/28 0550) SpO2:  [95 %-100 %] 99 % (09/28 0550)  Intake/Output from previous day: 09/27 0701 - 09/28 0700 In: 3998 [P.O.:360; I.V.:3288; IV Piggyback:350] Out: 3430 [Urine:2530; Blood:900] Intake/Output this shift: No intake/output data recorded.  Recent Labs    10/18/18 1700 10/19/18 0343 10/20/18 0320  HGB 12.4 10.6* 7.1*   Recent Labs    10/19/18 0343 10/20/18 0320  WBC 8.2 9.2  RBC 3.46* 2.29*  HCT 32.5* 22.0*  PLT 240 160   Recent Labs    10/19/18 0343 10/20/18 0320  NA 135 137  K 3.7 3.7  CL 102 104  CO2 26 27  BUN 8 8  CREATININE 0.61 0.59  GLUCOSE 147* 148*  CALCIUM 8.1* 7.7*   Recent Labs    10/18/18 1700 10/19/18 0343  INR 1.0 1.0    Neurologically intact Neurovascular intact Sensation intact distally Intact pulses distally Dorsiflexion/Plantar flexion intact Incision: dressing C/D/I No cellulitis present Compartment soft   Assessment/Plan: 1 Day Post-Op Procedure(s) (LRB): TOTAL HIP ARTHROPLASTY ANTERIOR APPROACH (Right) Advance diet Up with therapy  WBAT RLE DVT ppx- we prefer ASA 325mg  bid x 6 weeks  ABLA- 7.1 this am.  Dr. Erlinda Hong has ordered one unit prbc Continue plan per medicine team F/u with Dr. Erlinda Hong 2 weeks post-op for suture removal       Aundra Dubin 10/20/2018, 7:16 AM

## 2018-10-20 NOTE — Progress Notes (Signed)
  Subjective:  Patient evaluated bedside this morning. Pain is well controlled overall. She says she has some lower back pain but thinks that's because she has been laying down. She understands her Hgb is low and needs a transfusion. We discussed giving her Lovenox for DVT prophylaxis when she is discharged form the hospital. 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 She says she is supposed to follow up with Ortho in 2 weeks.   Objective:   Vital Signs (last 24 hours): Vitals:   10/19/18 1456 10/19/18 2121 10/20/18 0548 10/20/18 0550  BP: 103/64 (!) 106/55 (!) 103/58 (!) 103/58  Pulse: 93 84 86 87  Resp: 18 16 18 18   Temp: 99.5 F (37.5 C) 98.8 F (37.1 C) 98.9 F (37.2 C) 98.8 F (37.1 C)  TempSrc: Oral Oral Oral Oral  SpO2: 96% 97% 98% 99%  Weight:      Height:       Physical Exam: General Alert and answers questions appropriately, no acute distress  Cardiac  regular rate and rhythm, no murmurs, rubs, or gallops  Pulmonary  clear to auscultation bilaterally without wheezes, rhonchi, or rales  Skin Incision dressing is clean/dry/intact.  No evidence for bleeding or hematoma  Extremities No peripheral edema   Assessment/Plan:   Principal Problem:   Displaced fracture of right femoral neck (HCC) Active Problems:   Hip fracture [facility])  Patient is a 68 year old female seen for past medical history of osteoporosis presented with right hip pain after fall event have a mildly displaced proximal right femoral neck fracture.  Patient is postop day 1 following total hip arthroplasty.  # Displaced fracture of right femoral neck: Patient had total hip arthroplasty performed yesterday by Dr. Erlinda Hong with orthopedic surgery.  Hemoglobin of 7.1 this morning from 10.6 prior to surgery.  No evidence for hematoma on exam, decrease hemoglobin to secondary to operative blood loss.  1 unit PRBC ordered per orthopedics, will  follow up posttransfusion H&H. *Physical therapy to evaluate patient today.   *We will follow-up posttransfusion H&H *Hydrocodone-acetaminophen 5-325 mg, 1 to 2 tablets every 4 hours as needed for moderate pain *Hydrocodone-acetaminophen 7.5-325 mg, 1 to 2 tablets every 4 hours as needed for severe pain *Morphine 1 mg every 2 hour as needed for breakthrough pain after oral analgesic  # Osteoporosis:  - Premarin 0.3 mg PO daily  Diet: Regular DVT Ppx: SCDs Dispo: Anticipated discharge in approximately 1 day  Earlene Plater, MD 10/20/2018, 8:21 AM Pager: [phone]

## 2018-10-21 DIAGNOSIS — Z881 Allergy status to other antibiotic agents status: Secondary | ICD-10-CM

## 2018-10-21 LAB — TYPE AND SCREEN
ABO/RH(D): A POS
Antibody Screen: NEGATIVE
Unit division: 0

## 2018-10-21 LAB — CBC
HCT: 24.7 % — ABNORMAL LOW (ref 36.0–46.0)
Hemoglobin: 8.2 g/dL — ABNORMAL LOW (ref 12.0–15.0)
MCH: 30.6 pg (ref 26.0–34.0)
MCHC: 33.2 g/dL (ref 30.0–36.0)
MCV: 92.2 fL (ref 80.0–100.0)
Platelets: 157 10*3/uL (ref 150–400)
RBC: 2.68 MIL/uL — ABNORMAL LOW (ref 3.87–5.11)
RDW: 15.9 % — ABNORMAL HIGH (ref 11.5–15.5)
WBC: 10.8 10*3/uL — ABNORMAL HIGH (ref 4.0–10.5)
nRBC: 0 % (ref 0.0–0.2)

## 2018-10-21 LAB — BPAM RBC
Blood Product Expiration Date: 202010182359
ISSUE DATE / TIME: 202009280851
Unit Type and Rh: 6200

## 2018-10-21 LAB — BASIC METABOLIC PANEL
Anion gap: 7 (ref 5–15)
BUN: <5 mg/dL — ABNORMAL LOW (ref 8–23)
CO2: 25 mmol/L (ref 22–32)
Calcium: 7.6 mg/dL — ABNORMAL LOW (ref 8.9–10.3)
Chloride: 104 mmol/L (ref 98–111)
Creatinine, Ser: 0.48 mg/dL (ref 0.44–1.00)
GFR calc Af Amer: >60 mL/min (ref >60)
GFR calc non Af Amer: >60 mL/min (ref >60)
Glucose, Bld: 111 mg/dL — ABNORMAL HIGH (ref 70–99)
Potassium: 3.4 mmol/L — ABNORMAL LOW (ref 3.5–5.1)
Sodium: 136 mmol/L (ref 135–145)

## 2018-10-21 MED ORDER — TRANEXAMIC ACID-NACL 1000-0.7 MG/100ML-% IV SOLN
1000.0000 mg | Freq: Once | INTRAVENOUS | Status: AC
  Administered 2018-10-21: 1000 mg via INTRAVENOUS
  Filled 2018-10-21: qty 100

## 2018-10-21 MED ORDER — ENOXAPARIN SODIUM 40 MG/0.4ML ~~LOC~~ SOLN
40.0000 mg | SUBCUTANEOUS | Status: DC
  Administered 2018-10-21: 40 mg via SUBCUTANEOUS
  Filled 2018-10-21: qty 0.4

## 2018-10-21 MED ORDER — ENOXAPARIN SODIUM 40 MG/0.4ML ~~LOC~~ SOLN: 40.0000 mg | SUBCUTANEOUS | 0 refills | Status: DC

## 2018-10-21 NOTE — Progress Notes (Signed)
Physical Therapy Treatment Patient Details Name: Carolyn [name] MRN: [id number] DOB: 12/20/1950 Today's Date: 10/21/2018    History of Present Illness 68-year-old female with significant past medical history of osteoporosis presented with right hip pain after a fall and found to have a mildly displaced proximal right femoral neck fracture, now s/p R anterior THA (WBAT).    PT Comments    Pt tolerated treatment well and is at a functional level appropriate for discharge. Pt requiring physical assistance for sit to supine for RLE management, but otherwise completes all mobility without physical assistance and without significant balance deviations. Pt demonstrates toe strike on LLE due to LLD, but otherwise gait is WFL. PT addressed stair training and troubleshooting car transfer, pt expressing increased comfort in these tasks at end of session. Recommendations: Home with outpatient PT and a RW (DME already delivered to room). Pt has met all goals or is at an adequate level for discharge, PT signing off.   Follow Up Recommendations  Outpatient PT     Equipment Recommendations  Rolling walker with 5" wheels    Recommendations for Other Services       Precautions / Restrictions Precautions Precautions: Fall Restrictions Weight Bearing Restrictions: Yes RLE Weight Bearing: Weight bearing as tolerated    Mobility  Bed Mobility Overal bed mobility: Needs Assistance Bed Mobility: Sit to Supine       Sit to supine: Min assist   General bed mobility comments: For RLE  Transfers Overall transfer level: Modified independent Equipment used: Rolling walker (2 wheeled) Transfers: Sit to/from Stand Sit to Stand: Modified independent (Device/Increase time)            Ambulation/Gait Ambulation/Gait assistance: Modified independent (Device/Increase time) Gait Distance (Feet): 100 Feet Assistive device: Rolling walker (2 wheeled) Gait Pattern/deviations: (toe walking on LLE 2/2  LLD) Gait velocity: reduced Gait velocity interpretation: 1.31 - 2.62 ft/sec, indicative of limited community ambulator     Stairs Stairs: Yes Stairs assistance: Modified independent (Device/Increase time) Stair Management: One rail Left;Step to pattern Number of Stairs: 1     Wheelchair Mobility    Modified Rankin (Stroke Patients Only)       Balance Overall balance assessment: Modified Independent Sitting-balance support: No upper extremity supported;Feet supported Sitting balance-Leahy Scale: Normal     Standing balance support: No upper extremity supported Standing balance-Leahy Scale: Good                              Cognition Arousal/Alertness: Awake/alert Behavior During Therapy: WFL for tasks assessed/performed Overall Cognitive Status: Within Functional Limits for tasks assessed                                        Exercises      General Comments        Pertinent Vitals/Pain Pain Assessment: 0-10 Pain Score: 6  Pain Location: RLE--groin Pain Descriptors / Indicators: Aching;Sore Pain Intervention(s): Limited activity within patient's tolerance    Home Living                      Prior Function            PT Goals (current goals can now be found in the care plan section) Acute Rehab PT Goals Patient Stated Goal: To get back to enjoying the grandkids Progress towards PT goals:   Goals met/education completed, patient discharged from PT    Frequency    Min 5X/week      PT Plan Current plan remains appropriate    Co-evaluation              AM-PAC PT "6 Clicks" Mobility   Outcome Measure  Help needed turning from your back to your side while in a flat bed without using bedrails?: None Help needed moving from lying on your back to sitting on the side of a flat bed without using bedrails?: A Little Help needed moving to and from a bed to a chair (including a wheelchair)?: None Help needed  standing up from a chair using your arms (e.g., wheelchair or bedside chair)?: None Help needed to walk in hospital room?: None Help needed climbing 3-5 steps with a railing? : None 6 Click Score: 23    End of Session Equipment Utilized During Treatment: Gait belt Activity Tolerance: Patient tolerated treatment well Patient left: in bed;with call bell/phone within reach;with family/visitor present Nurse Communication: Mobility status PT Visit Diagnosis: Other abnormalities of gait and mobility (R26.89)     Time: 1002-1034 PT Time Calculation (min) (ACUTE ONLY): 32 min  Charges:  $Gait Training: 8-22 mins $Therapeutic Activity: 8-22 mins                     Ryan J Lee, PT, DPT Acute Rehabilitation Pager: [phone]    Ryan J Lee 10/21/2018, 12:45 PM   

## 2018-10-21 NOTE — TOC Transition Note (Signed)
Transition of Care [facility]) - CM/SW Discharge Note   Patient Details  Name: Carolyn [name] MRN: [id number] Date of Birth: 02/11/1950  Transition of Care [facility]) CM/SW Contact:  Rae Mar, RN Phone Number: 10/21/2018, 12:41 PM   Clinical Narrative:     CM noted PT/OT recommendations and placed appropriate ambulatory referral to closest [facility] near patients home.  Also consulted for a PCP needs.  CM spoke with pt and spouse at beside about outpatient PT.  Pt had planned to go to the location CM sent referral already.  Pt also advised she did not want a new PCP but also was not sure if she wanted to travel to see hers in the next week or two.  CM discussed options, like having her PCP send orders to a local lab instead of her having to be in a car for 200 miles.  Pt and spouse plan to send their PCP a message through [place] today and make a plan for follow up needs.  Updated Dr. Benjamine Mola and primary RN.  No further TOC needs noted at this time.  Final next level of care: OP Rehab Barriers to Discharge: No Barriers Identified, Barriers Resolved    Discharge Plan and Services   Discharge Planning Services: CM Consult            DME Arranged: Walker rolling DME Agency: [employer] Date DME Agency Contacted: 10/20/18 Time DME Agency Contacted: [phone] Representative spoke with at DME Agency: [place]: NA    Ambulatory referral sent to [facility] for PT.   Readmission Risk Interventions No flowsheet data found.

## 2018-10-21 NOTE — Progress Notes (Addendum)
  Subjective:  Patient evaluated bedside this morning on rounds. The patient is sitting up in the bedside chair. The patient says she has been walking with the walker and working with PT and feels well. We discussed the importance to take Lovenox for 2 weeks post discharge. She says she hasn't been taught how to give the Lovenox to herself yet.   Objective:   Vital Signs (last 24 hours): Vitals:   10/20/18 2030 10/21/18 0440 10/21/18 0546 10/21/18 0740  BP: 107/62 110/60  (!) 108/59  Pulse: 97 90  94  Resp: 18 18  18   Temp: 100.1 F (37.8 C) 100 F (37.8 C) 98.9 F (37.2 C) 99 F (37.2 C)  TempSrc: Oral Oral Oral Oral  SpO2: 97% 94%  98%  Weight:      Height:       Physical Exam: General Alert and answers questions appropriately, no acute distress  Cardiac  regular rate and rhythm, no murmurs, rubs, or gallops  Pulmonary  clear to auscultation bilaterally without wheezes, rhonchi, or rales  Skin  incision dressing is clean/dry/intact.  No evidence for bleeding or hematoma.  Extremities No peripheral edema   Assessment/Plan:   Principal Problem:   Displaced fracture of right femoral neck (HCC) Active Problems:   Hip fracture (HCC)  Patient is a 68-year-old female with past medical history of osteoporosis who presented with right hip pain after fall and found to have mildly displaced proximal right femoral neck fracture.  Patient is postop day 2 following total hip arthroplasty.  # Displaced fracture of right femoral neck: Patient had total hip arthroplasty performed yesterday by Dr. Erlinda Hong with orthopedic surgery.   *Outpatient physical therapy, walker, 3 in 1 bedside commode ordered *Plan for DVT prophylaxis with Lovenox for 2 weeks *Percocet as needed for pain, prescription sent per orthopedics *Follow-up appointment with orthopedics in 2 weeks *Follow-up appointment in 1-2 weeks with PCP to check hemoglobin.  Will recommend patient discuss his bisphosphonate therapy with PCP.  *Physical therapy to see patient today and provide instructions on navigating stairs.  RN requested to provide instruction on Lovenox administration.  Social work requested to provide PCP resources.  # Post-operative anemia: Hemoglobin of 7.1 following surgery, transfused 1 unit with posttransfusion H&H of 9.6 (inappropriately high).  Repeat CBC this morning had a hemoglobin of 8.2 which is stable at appropriate level following 1 unit PRBC yesterday. *We will recommend repeat CBC by PCP as outpatient in 1-2 weeks  # Osteoporosis:  - Premarin 0.3 mg PO daily  Diet: Regular DVT Ppx: SCDs Dispo: Anticipated discharge in approximately today  Earlene Plater, MD 10/21/2018, 9:52 AM Pager: [phone]

## 2018-10-21 NOTE — Plan of Care (Signed)
  Problem: Education: Goal: Verbalization of understanding the information provided (i.e., activity precautions, restrictions, etc) will improve Outcome: Progressing   Problem: Activity: Goal: Ability to ambulate and perform ADLs will improve Outcome: Progressing   Problem: Pain Management: Goal: Pain level will decrease Outcome: Progressing   Problem: Education: Goal: Knowledge of General Education information will improve Description: Including pain rating scale, medication(s)/side effects and non-pharmacologic comfort measures Outcome: Progressing   Problem: Activity: Goal: Risk for activity intolerance will decrease Outcome: Progressing   Problem: Pain Managment: Goal: General experience of comfort will improve Outcome: Progressing   Problem: Safety: Goal: Ability to remain free from injury will improve Outcome: Progressing   Problem: Skin Integrity: Goal: Risk for impaired skin integrity will decrease Outcome: Progressing

## 2018-10-21 NOTE — Progress Notes (Addendum)
Subjective: 2 Days Post-Op Procedure(s) (LRB): TOTAL HIP ARTHROPLASTY ANTERIOR APPROACH (Right) Patient reports pain as mild.  Slept well last night and feels well this am.   Objective: Vital signs in last 24 hours: Temp:  [98.5 F (36.9 C)-100.1 F (37.8 C)] 98.9 F (37.2 C) (09/29 0546) Pulse Rate:  [79-97] 90 (09/29 0440) Resp:  [16-18] 18 (09/29 0440) BP: (89-110)/(49-62) 110/60 (09/29 0440) SpO2:  [94 %-99 %] 94 % (09/29 0440)  Intake/Output from previous day: 09/28 0701 - 09/29 0700 In: 1335 [P.O.:720; I.V.:300; Blood:315] Out: 400 [Urine:400] Intake/Output this shift: No intake/output data recorded.  Recent Labs    10/18/18 1700 10/19/18 0343 10/20/18 0320 10/20/18 1424 10/21/18 0526  HGB 12.4 10.6* 7.1* 9.6* 8.2*   Recent Labs    10/20/18 0320 10/20/18 1424 10/21/18 0526  WBC 9.2  --  10.8*  RBC 2.29*  --  2.68*  HCT 22.0* 27.8* 24.7*  PLT 160  --  157   Recent Labs    10/20/18 0320 10/21/18 0526  NA 137 136  K 3.7 3.4*  CL 104 104  CO2 27 25  BUN 8 <5*  CREATININE 0.59 0.48  GLUCOSE 148* 111*  CALCIUM 7.7* 7.6*   Recent Labs    10/18/18 1700 10/19/18 0343  INR 1.0 1.0    Neurologically intact Neurovascular intact Sensation intact distally Intact pulses distally Dorsiflexion/Plantar flexion intact Incision: dressing C/D/I No cellulitis present Compartment soft   Assessment/Plan: 2 Days Post-Op Procedure(s) (LRB): TOTAL HIP ARTHROPLASTY ANTERIOR APPROACH (Right) Up with therapy  WBAT RLE DVT ppx- will change to Lovenox 40 daily x 14 weeks ABLA- transfused with 1 un it prbc yesterday.  Hemoglobin 8.2 this am. Will order1 gram iv txa.  Ice pack to right hip needs to be applied around the clock F/u with Dr. Erlinda Hong 2 weeks post-op for suture removal       Aundra Dubin 10/21/2018, 7:30 AM

## 2018-10-21 NOTE — Progress Notes (Signed)
Pt discharge teaching and avs complete, all questions answered. Lovenox home injection teaching complete, all questions answered. Pt belongings gathered and is ready for discharge. [place], RN 10/21/2018 1:47 PM

## 2018-10-21 NOTE — Care Management Important Message (Signed)
Important Message  Patient Details  Name: Carolyn [name] MRN: [id number] Date of Birth: 07-22-1950   Medicare Important Message Given:  Yes     [name] Montine Circle 10/21/2018, 2:31 PM

## 2018-10-22 NOTE — Discharge Summary (Signed)
Name: Carolyn [name] MRN: [id number] DOB: January 13, 1951 68 y.o. PCP: Carolyn Ochs, MD  Date of Admission: 10/18/2018  2:16 PM Date of Discharge: 10/21/2018 Attending Physician: Dr. Dareen [name]  Discharge Diagnosis: 1. Displaced fracture of right femoral neck  Discharge Medications: Allergies as of 10/21/2018      Reactions   Sulfa Antibiotics Swelling   Vaginal swelling from suppository      Medication List    STOP taking these medications   acetaminophen 325 MG tablet Commonly known as: Tylenol   acetaminophen 500 MG tablet Commonly known as: TYLENOL     TAKE these medications   Calcium+D3 600-800 MG-UNIT Tabs Generic drug: Calcium Carb-Cholecalciferol Take 1 tablet by mouth every morning.   enoxaparin 40 MG/0.4ML injection Commonly known as: LOVENOX Inject 0.4 mLs (40 mg total) into the skin daily.   fluticasone 50 MCG/ACT nasal spray Commonly known as: FLONASE Place 1 spray into both nostrils at bedtime as needed for allergies or rhinitis (seasonal allergies).   ibuprofen 200 MG tablet Commonly known as: Advil Take 3 tablets (600 mg total) by mouth every 6 (six) hours as needed for moderate pain. What changed:   how much to take  reasons to take this   loratadine 10 MG tablet Commonly known as: CLARITIN Take 10 mg by mouth daily as needed for allergies.   ondansetron 4 MG tablet Commonly known as: Zofran Take 1 tablet (4 mg total) by mouth every 8 (eight) hours as needed for nausea or vomiting.   OVER THE COUNTER MEDICATION Place 1 drop into both eyes daily as needed (itching/seasonal allergies). Over the counter allergy eye drop   oxyCODONE-acetaminophen 5-325 MG tablet Commonly known as: Percocet Take 1-2 tablets by mouth every 8 (eight) hours as needed for severe pain.   Premarin 0.3 MG tablet Generic drug: estrogens (conjugated) Take 0.3 mg by mouth every morning.   Vitamin D3 50 MCG (2000 UT) Tabs Take 1 tablet by mouth every morning.             Durable Medical Equipment  (From admission, onward)         Start     Ordered   10/21/18 0000  For home use only DME 3 n 1     10/21/18 1301           Discharge Care Instructions  (From admission, onward)         Start     Ordered   10/19/18 0000  Weight bearing as tolerated     10/19/18 0925          Disposition and follow-up:   Ms.Carolyn [name] was discharged from [facility] in Stable condition.  At the hospital follow up visit please address:  1.  Please check patient hemoglobin as patient had postoperative hemoglobin of 7.1 and required 1 unit PRBC.  Please discuss initiating patient [name] bisphosphonate therapy for osteoporosis.  2.  Labs / imaging needed at time of follow-up: CBC  3.  Pending labs/ test needing follow-up: None  Follow-up Appointments: Follow-up Information    Carolyn Koyanagi, MD In 2 weeks.   Specialty: Orthopedic Surgery Why: For suture removal, For wound re-check Contact information: [place] [address] [phone]        PCP Follow up.   Why: Please followup with a primary care provider in 1 to 2 weeks to check your hemolgobin and to discuss bisphosphonate therapy for your osteoporosis  [facility] Follow up.   Specialty: Rehabilitation Why: Outpatient physical therapy - You can call them if they don't call you first. Contact information: [address] [place] [facility] Course by problem list: # Anemia: # Displaced fracture of right femoral neck: Patient with significant past medical history of osteoporosis who presented with right hip pain after a fall and found to have a mildly displaced proximal right femoral neck fracture.  Patient had total hip arthroplasty performed [name] 10/19/2018 by Dr. Erlinda [name] with orthopedic surgery.  Patient had  postoperative hemoglobin of 7.1 from 10.6 prior to surgery.  Patient transfused 1 unit PRBC with hemoglobin following morning of 8.2.  Patient discharged with short course of Percocet for pain.  Patient provided with prescriptions and instructions for Lovenox 40 mg once daily for 14 days.  She was advised to follow-up with orthopedic surgeons prior to end of Lovenox therapy to discuss further DVT prophylaxis with aspirin.  Patient was also advised to follow-up with primary care provider to check her hemoglobin and to discuss starting bisphosphonate therapy for osteoporosis.  Patient's primary care provider was located at a far distance and patient was provided with information [name] PCPs in the area.  Discharge Vitals:   BP (!) 108/59 (BP Location: Left Arm)   Pulse 94   Temp 99 F (37.2 C) (Oral)   Resp 18   Ht 5' 4.02" (1.626 m)   Wt 59 kg   SpO2 98%   BMI 22.30 kg/m   Pertinent Labs, Studies, and Procedures:  CBC Latest Ref Rng & Units 10/21/2018 10/20/2018 10/20/2018  WBC 4.0 - 10.5 K/uL 10.8(H) - 9.2  Hemoglobin 12.0 - 15.0 g/dL 8.2(L) 9.6(L) 7.1(L)  Hematocrit 36.0 - 46.0 % 24.7(L) 27.8(L) 22.0(L)  Platelets 150 - 400 K/uL 157 - 160   BMP Latest Ref Rng & Units 10/21/2018 10/20/2018 10/19/2018  Glucose 70 - 99 mg/dL 111(H) 148(H) 147(H)  BUN 8 - 23 mg/dL <5(L) 8 8  Creatinine 0.44 - 1.00 mg/dL 0.48 0.59 0.61  Sodium 135 - 145 mmol/L 136 137 135  Potassium 3.5 - 5.1 mmol/L 3.4(L) 3.7 3.7  Chloride 98 - 111 mmol/L 104 104 102  CO2 22 - 32 mmol/L 25 27 26   Calcium 8.9 - 10.3 mg/dL 7.6(L) 7.7(L) 8.1(L)   DG Hip Unilateral (10/18/2018): IMPRESSION: Mildly displaced proximal right femoral neck fracture.  DG Hip Unilateral (10/19/2018): FINDINGS: Patient is status post right hip replacement. Hardware is in good position [name] the 2 frontal views.  IMPRESSION: Right hip replacement as above.   Discharge Instructions: Discharge Instructions    Ambulatory referral to Physical Therapy    Complete by: As directed    Diet - low sodium heart healthy   Complete by: As directed    Diet - low sodium heart healthy   Complete by: As directed    Discharge instructions   Complete by: As directed    You were seen in the hospital after a hip fracture and had surgery. Because of blood loss during the surgery, you were given a blood transfusion. If you become light-headed, have chest pain, develop shortness of breath, or have any other concerning symptom please call your doctor or return to the hospital.  To prevent blood clots in your legs, we want you to take lovenox 40 mg once daily for the next 14 days. At your followup appointment  with your orthopedic doctors, they may recommend further therapy to prevent blood clots. Please ensure that you have discussed this with your orthopedic doctors prior to the last day of your lovenox therapy.  You have been given a prescription for percocet to take as needed for pain.   Discharge instructions   Complete by: As directed    You were seen for a hip fracture and had surgery performed by orthopedic surgery.  To prevent a blood clot we want you to use Lovenox 40 mg once daily for 2 weeks.  At your follow-up appointment with orthopedic surgery in 2 weeks, they may decide to continue [name] aspirin to prevent blood clots.  Please schedule this appointment before the end of your Lovenox therapy.  You have been sent a prescription for Percocet for pain.  Please follow-up with your primary care provider in 1 to 2 weeks so that they can check your hemoglobin.  You were given 1 unit of blood because of low hemoglobin following your blood surgery.  Please also discuss starting a bisphosphonate for your osteoporosis.   For home use only DME 3 n 1   Complete by: As directed    Increase activity slowly   Complete by: As directed    Increase activity slowly   Complete by: As directed    Weight bearing as tolerated   Complete by: As directed       Signed:  Jeanmarie Hubert, MD 10/22/2018, 4:41 PM   Pager: [phone]

## 2018-10-27 ENCOUNTER — Encounter: Payer: Self-pay | Admitting: Physical Therapy

## 2018-10-27 ENCOUNTER — Ambulatory Visit (INDEPENDENT_AMBULATORY_CARE_PROVIDER_SITE_OTHER): Payer: Medicare HMO | Admitting: Physical Therapy

## 2018-10-27 ENCOUNTER — Other Ambulatory Visit: Payer: Self-pay

## 2018-10-27 DIAGNOSIS — M6281 Muscle weakness (generalized): Secondary | ICD-10-CM | POA: Diagnosis not present

## 2018-10-27 DIAGNOSIS — R2689 Other abnormalities of gait and mobility: Secondary | ICD-10-CM | POA: Diagnosis not present

## 2018-10-27 DIAGNOSIS — M25551 Pain in right hip: Secondary | ICD-10-CM | POA: Diagnosis not present

## 2018-10-27 NOTE — Therapy (Signed)
[place], [place], [address] Phone: [phone]   Fax:  [phone]  Physical Therapy Evaluation  Patient Details  Name: Carolyn [name] MRN: [id number] Date of Birth: 15-Aug-1950 Referring Provider (PT): Aldine Contes, MD   Encounter Date: 10/27/2018  PT End of Session - 10/27/18 1018    Visit Number  1    Number of Visits  13    Date for PT Re-Evaluation  12/22/18    PT Start Time  1017    PT Stop Time  1100    PT Time Calculation (min)  43 min    Activity Tolerance  Patient tolerated treatment well    Behavior During Therapy  [facility] for tasks assessed/performed       Past Medical History:  Diagnosis Date  . Arthritis    neck  . Complication of anesthesia   . Osteoporosis   . PONV (postoperative nausea and vomiting)   . Thumb pain, left     Past Surgical History:  Procedure Laterality Date  . ABDOMINAL HYSTERECTOMY    . CESAREAN SECTION    . OOPHORECTOMY    . TENDON TRANSFER Left 02/07/2016   Procedure: LEFT THUMB TENDON TRANSFER;  Surgeon: Milly Jakob, MD;  Location: [place];  Service: Orthopedics;  Laterality: Left;  . TOTAL HIP ARTHROPLASTY Right 10/19/2018   Procedure: TOTAL HIP ARTHROPLASTY ANTERIOR APPROACH;  Surgeon: Leandrew Koyanagi, MD;  Location: [place];  Service: Orthopedics;  Laterality: Right;    There were no vitals filed for this visit.   Subjective Assessment - 10/27/18 1257    Subjective  pt arriving to therpay following R THA on 10/19/2018. Pt reporting pain of 5/10. Pt amb with rolling walker with antalgic gait.    Pertinent History  R THA on 10/19/2018, osteoporosis, arthritis, L thumb tendon transfer    Limitations  Walking;Lifting;House hold activities;Other (comment);Standing    Patient Stated Goals  I want to get back to going to the gym with my husband.    Currently in Pain?  Yes    Pain Score  5     Pain Location  Hip    Pain Orientation   Right    Pain Descriptors / Indicators  Aching    Pain Type  Surgical pain;Acute pain    Pain Onset  1 to 4 weeks ago    Pain Frequency  Constant    Aggravating Factors   walking, sitting too long, standing too long, bending    Pain Relieving Factors  resting, changing positions, medications    Effect of Pain on Daily Activities  unable to perform some ADL's indepdentely, having to use my walker and unable to perform house hold chores         [facility] PT Assessment - 10/27/18 0001      Assessment   Medical Diagnosis  S72.001 closed displaced fx of R femoral neck    Referring Provider (PT)  Aldine Contes, MD    Onset Date/Surgical Date  10/19/18    Hand Dominance  Right    Prior Therapy  yes, for shoulder      Precautions   Precautions  None      Restrictions   Weight Bearing Restrictions  No      Balance Screen   Has the patient fallen in the past 6 months  No    Is the patient reluctant to leave their home because of a fear of falling?  No      Home Environment   Living Environment  Private residence    Living Arrangements  Spouse/significant other    Type of [place] to enter    Entrance Stairs-Number of Steps  1    [place] seat;Grab bars - tub/shower;Hand held shower head      Prior Function   Level of [place]  Retired    Leisure  spend time with grandchildren      Cognition   Overall Cognitive Status  Within Functional Limits for tasks assessed      Observation/Other Assessments   Focus on Therapeutic Outcomes (FOTO)   67% limitation      ROM / Strength   AROM / PROM / Strength  AROM;Strength      AROM   Overall AROM   Deficits    AROM Assessment Site  Hip      Strength   Strength Assessment Site  Hip    Right/Left Hip  Right;Left    Right Hip Flexion  3/5    Right Hip Extension  3/5    Right Hip ABduction  3+/5    Right Hip ADduction  3+/5     Left Hip Flexion  5/5    Left Hip Extension  5/5    Left Hip ABduction  4/5    Left Hip ADduction  4/5      Flexibility   Soft Tissue Assessment /Muscle Length  yes    Hamstrings  R: 56 degrees, L: 65 degrees      Transfers   Five time sit to stand comments   25.5 seconds using UE support      Ambulation/Gait   Assistive device  Rolling walker    Gait Pattern  Step-through pattern;Decreased stance time - right;Antalgic                Objective measurements completed on examination: See above findings.              PT Education - 10/27/18 1303    Education Details  PT POC, HEP    Person(s) Educated  Patient    Methods  Explanation;Demonstration;Verbal cues;Handout    Comprehension  Verbalized understanding;Returned demonstration          PT Long Term Goals - 10/27/18 1019      PT LONG TERM GOAL #1   Title  Independent with HEP and progression    Time  6    Status  New    Target Date  12/08/18      PT LONG TERM GOAL #2   Title  R hip ROM WFL for noraml ADL's with no pain reported.    Time  6    Period  Weeks    Status  New      PT LONG TERM GOAL #3   Title  Pt will improve her R hip strength to grossly 4+/5 in order to improve gait and functional mobility.    Time  6    Period  Weeks    Status  New    Target Date  12/08/18      PT LONG TERM GOAL #4   Title  Pt will be able to amb community distances and on level/unlevel surfaces for >/ 2000 feet with no assistive device.    Baseline  pt amb with rolling walker  short house hold distances    Time  6    Period  Weeks    Status  New    Target Date  12/08/18      PT LONG TERM GOAL #5   Title  pt will improve her FOTO score from 67% limitation to </= 47% limitation.    Baseline  67% limitaiton on 10/27/2018    Time  6    Period  Weeks    Status  New             Plan - 10/27/18 1111    Clinical Impression Statement  Pt arriving to therapy for PT evaluation s/p closed displaced fx  of R femoral neck. R THA  on 10/19/2018. Pt reporting pain of 5/10 in R hip. Pt amb with rolling walker with decreased stance on R LE and antalgic gait noted with step through gait pattern. Pt presenting with IT band tendrerness and R piriformis tenderness. Pt also with weakness noted in R hip compared to left. Skilled PT needed to address pt's impairments with the below interventions.    Personal Factors and Comorbidities  Comorbidity 2    Comorbidities  arthritis, osteoporosis, tendon transfer in L thumb    Examination-Activity Limitations  Dressing;Sit;Transfers;Stairs;Stand    Examination-Participation Restrictions  Other;Cleaning;Community Activity;Meal Prep    Stability/Clinical Decision Making  Stable/Uncomplicated    Clinical Decision Making  Low    Rehab Potential  Good    PT Frequency  2x / week    PT Duration  6 weeks    PT Treatment/Interventions  ADLs/Self Care Home Management;Cryotherapy;Electrical Stimulation;Ultrasound;Moist Heat;Therapeutic activities;Functional mobility training;Stair training;Gait training;Therapeutic exercise;Balance training;Neuromuscular re-education;Manual techniques;Patient/family education;Passive range of motion    PT Next Visit Plan  LE strengthening, gait, modalities as needed    PT Home Exercise Plan  Access Code: [email or web address]    Consulted and Agree with Plan of Care  Patient       Patient will benefit from skilled therapeutic intervention in order to improve the following deficits and impairments:  Difficulty walking, Abnormal gait, Pain, Postural dysfunction, Decreased strength, Increased edema, Decreased range of motion, Decreased activity tolerance, Decreased mobility  Visit Diagnosis: Pain in right hip  Muscle weakness (generalized)  Other abnormalities of gait and mobility     Problem List Patient Active Problem List   Diagnosis Date Noted  . Displaced fracture of right femoral neck ([place]) 10/18/2018  . Hip fracture ([place]) 10/18/2018    Kearney Hard, PT 10/27/18 1:04 PM    Oretha Caprice 10/27/2018, 1:04 PM  [facility] [place], [place], [address] Phone: [phone]   Fax:  [phone]  Name: Carolyn [name] MRN: [id number] Date of Birth: 19-Sep-1950

## 2018-10-28 ENCOUNTER — Inpatient Hospital Stay: Payer: Medicare HMO | Admitting: Orthopaedic Surgery

## 2018-10-30 ENCOUNTER — Encounter: Payer: Self-pay | Admitting: Physical Therapy

## 2018-10-30 ENCOUNTER — Other Ambulatory Visit: Payer: Self-pay

## 2018-10-30 ENCOUNTER — Ambulatory Visit (INDEPENDENT_AMBULATORY_CARE_PROVIDER_SITE_OTHER): Payer: Medicare HMO | Admitting: Physical Therapy

## 2018-10-30 DIAGNOSIS — M25551 Pain in right hip: Secondary | ICD-10-CM | POA: Diagnosis not present

## 2018-10-30 DIAGNOSIS — M6281 Muscle weakness (generalized): Secondary | ICD-10-CM

## 2018-10-30 DIAGNOSIS — R2689 Other abnormalities of gait and mobility: Secondary | ICD-10-CM

## 2018-10-30 NOTE — Therapy (Signed)
[place], [place], [address] Phone: [phone]   Fax:  [phone]  Physical Therapy Treatment  Patient Details  Name: Carolyn [name] MRN: [id number] Date of Birth: March 26, 1950 Referring Provider (PT): Aldine Contes, MD   Encounter Date: 10/30/2018  PT End of Session - 10/30/18 1028    Visit Number  2    Number of Visits  13    Date for PT Re-Evaluation  12/22/18    PT Start Time  [id number]   pt arrived late   PT Stop Time  1058    PT Time Calculation (min)  35 min    Activity Tolerance  Patient tolerated treatment well;No increased pain    Behavior During Therapy  WFL for tasks assessed/performed       Past Medical History:  Diagnosis Date  . Arthritis    neck  . Complication of anesthesia   . Osteoporosis   . PONV (postoperative nausea and vomiting)   . Thumb pain, left     Past Surgical History:  Procedure Laterality Date  . ABDOMINAL HYSTERECTOMY    . CESAREAN SECTION    . OOPHORECTOMY    . TENDON TRANSFER Left 02/07/2016   Procedure: LEFT THUMB TENDON TRANSFER;  Surgeon: Milly Jakob, MD;  Location: [place];  Service: Orthopedics;  Laterality: Left;  . TOTAL HIP ARTHROPLASTY Right 10/19/2018   Procedure: TOTAL HIP ARTHROPLASTY ANTERIOR APPROACH;  Surgeon: Leandrew Koyanagi, MD;  Location: [place];  Service: Orthopedics;  Laterality: Right;    There were no vitals filed for this visit.  Subjective Assessment - 10/30/18 1028    Subjective  Pt reports she feels a little stronger.    Patient Stated Goals  I want to get back to going to the gym with my husband.    Currently in Pain?  Yes   pt took 3 advil prior to session   Pain Score  2     Pain Location  Hip    Pain Orientation  Right    Pain Descriptors / Indicators  Dull;Tender    Aggravating Factors   prolonged sitting/ standing    Pain Relieving Factors  medication, rest.         [facility] PT Assessment -  10/30/18 0001      Assessment   Medical Diagnosis  S72.001 closed displaced fx of R femoral neck    Referring Provider (PT)  Aldine Contes, MD    Onset Date/Surgical Date  10/19/18    Hand Dominance  Right    Prior Therapy  yes, for shoulder      Special Tests    Special Tests  Leg LengthTest    Leg length test   True      True   Right  83 in.    Left   82 in.       [facility] Adult PT Treatment/Exercise - 10/30/18 0001      Ambulation/Gait   Ambulation Distance (Feet)  160 Feet    Assistive device  Straight cane    Gait Pattern  Step-through pattern;Decreased step length - right;Decreased stance time - right;Decreased weight shift to right    Ambulation Surface  Indoor;Level    Gait Comments  cues for sequence, cane placement and even step length.        Self-Care   Self-Care  Other Self-Care Comments    Other Self-Care Comments   educated pt on gentle lymphatic massage  To  RLE, can use roller stick to hamstring/quad, avoiding incision. Pt verbalized understanding.      Lumbar Exercises: Quadruped   [place]  --   3 reps    Other Quadruped Lumbar Exercises  standing to/from quadruped to supine (onto yoga mat) with UE support on mat and cues for sequence.      Other Quadruped Lumbar Exercises  weight shifts side to side/ front to back; attempt to sit back into childs pose, limited hip flexion on R.       Knee/Hip Exercises: Stretches   Hip Flexor Stretch  Right;2 reps;20 seconds   standing, hip/knee extended and toe on ground, UE on RW     Knee/Hip Exercises: Aerobic   Nustep  L4: legs only 6 min       Knee/Hip Exercises: Standing   SLS  RLE x 15 and 30 sec, without UE support.  Lt SLS with Rt leg swings forward/ backward (per her old HEP from hospital)      Knee/Hip Exercises: Supine   Bridges  10 reps    Straight Leg Raises  Right;1 set;10 reps      Knee/Hip Exercises: Sidelying   Clams  RLE x 10 reps- tactile cues to not trwist from spine                PT Short Term Goals - 10/30/18 1317      PT SHORT TERM GOAL #1   Title  Access need for shoe lift for L LE due to Leg Length discrepency.    Time  3    Period  Weeks    Status  Achieved    Target Date  11/17/18        PT Long Term Goals - 10/27/18 1019      PT LONG TERM GOAL #1   Title  Independent with HEP and progression    Time  6    Status  New    Target Date  12/08/18      PT LONG TERM GOAL #2   Title  R hip ROM WFL for noraml ADL's with no pain reported.    Time  6    Period  Weeks    Status  New      PT LONG TERM GOAL #3   Title  Pt will improve her R hip strength to grossly 4+/5 in order to improve gait and functional mobility.    Time  6    Period  Weeks    Status  New    Target Date  12/08/18      PT LONG TERM GOAL #4   Title  Pt will be able to amb community distances and on level/unlevel surfaces for >/ 2000 feet with no assistive device.    Baseline  pt amb with rolling walker short house hold distances    Time  6    Period  Weeks    Status  New    Target Date  12/08/18      PT LONG TERM GOAL #5   Title  pt will improve her FOTO score from 67% limitation to </= 47% limitation.    Baseline  67% limitaiton on 10/27/2018    Time  6    Period  Weeks    Status  New            Plan - 10/30/18 1308    Clinical Impression Statement  Pt tolerated all exercises of HEP well, and without difficulty.  Pt was interested in learning how to get up/down from floor because she would like to do exercises on yoga mat; able to do this with only minor cues.  Trial with SPC went well with only minor cues. Pt issued 1cm heel lift for left shoe.  Progressing towards goals.    Personal Factors and Comorbidities  Comorbidity 2    Comorbidities  arthritis, osteoporosis, tendon transfer in L thumb    Examination-Activity Limitations  Dressing;Sit;Transfers;Stairs;Stand    Examination-Participation Restrictions  Other;Cleaning;Community Activity;Meal  Prep    Stability/Clinical Decision Making  Stable/Uncomplicated    Rehab Potential  Good    PT Frequency  2x / week    PT Duration  6 weeks    PT Treatment/Interventions  ADLs/Self Care Home Management;Cryotherapy;Electrical Stimulation;Ultrasound;Moist Heat;Therapeutic activities;Functional mobility training;Stair training;Gait training;Therapeutic exercise;Balance training;Neuromuscular re-education;Manual techniques;Patient/family education;Passive range of motion    PT Next Visit Plan  LE strengthening, gait, modalities as needed.  consolidate exercises from hospital and our new HEP; progress as tolerated.    PT Home Exercise Plan  Access Code: [email or web address]    Consulted and Agree with Plan of Care  Patient       Patient will benefit from skilled therapeutic intervention in order to improve the following deficits and impairments:  Difficulty walking, Abnormal gait, Pain, Postural dysfunction, Decreased strength, Increased edema, Decreased range of motion, Decreased activity tolerance, Decreased mobility  Visit Diagnosis: Pain in right hip  Muscle weakness (generalized)  Other abnormalities of gait and mobility     Problem List Patient Active Problem List   Diagnosis Date Noted  . Displaced fracture of right femoral neck ([place]) 10/18/2018  . Hip fracture [facility]) 10/18/2018   Kerin Perna, PTA 10/30/18 1:17 PM  [place], [place], [address] Phone: [phone]   Fax:  [phone]  Name: Carolyn [name] MRN: [id number] Date of Birth: 29-Dec-1950

## 2018-11-03 ENCOUNTER — Other Ambulatory Visit: Payer: Self-pay

## 2018-11-03 ENCOUNTER — Encounter: Payer: Self-pay | Admitting: Physical Therapy

## 2018-11-03 ENCOUNTER — Ambulatory Visit (INDEPENDENT_AMBULATORY_CARE_PROVIDER_SITE_OTHER): Payer: Medicare HMO | Admitting: Physical Therapy

## 2018-11-03 DIAGNOSIS — M6281 Muscle weakness (generalized): Secondary | ICD-10-CM | POA: Diagnosis not present

## 2018-11-03 DIAGNOSIS — R2689 Other abnormalities of gait and mobility: Secondary | ICD-10-CM

## 2018-11-03 DIAGNOSIS — M25551 Pain in right hip: Secondary | ICD-10-CM

## 2018-11-03 NOTE — Therapy (Signed)
[place], [place], [address] Phone: [phone]   Fax:  [phone]  Physical Therapy Treatment  Patient Details  Name: Carolyn [name] MRN: [id number] Date of Birth: 1950/08/28 Referring Provider (PT): Aldine Contes, MD   Encounter Date: 11/03/2018  PT End of Session - 11/03/18 1105    Visit Number  3    Number of Visits  13    Date for PT Re-Evaluation  12/22/18    PT Start Time  1104    PT Stop Time  1145    PT Time Calculation (min)  41 min    Activity Tolerance  Patient tolerated treatment well;No increased pain    Behavior During Therapy  WFL for tasks assessed/performed       Past Medical History:  Diagnosis Date  . Arthritis    neck  . Complication of anesthesia   . Osteoporosis   . PONV (postoperative nausea and vomiting)   . Thumb pain, left     Past Surgical History:  Procedure Laterality Date  . ABDOMINAL HYSTERECTOMY    . CESAREAN SECTION    . OOPHORECTOMY    . TENDON TRANSFER Left 02/07/2016   Procedure: LEFT THUMB TENDON TRANSFER;  Surgeon: Milly Jakob, MD;  Location: [place];  Service: Orthopedics;  Laterality: Left;  . TOTAL HIP ARTHROPLASTY Right 10/19/2018   Procedure: TOTAL HIP ARTHROPLASTY ANTERIOR APPROACH;  Surgeon: Leandrew Koyanagi, MD;  Location: [place];  Service: Orthopedics;  Laterality: Right;    There were no vitals filed for this visit.  Subjective Assessment - 11/03/18 1106    Subjective  Patient presents using cane and bringing hospital exercises. Hurting some in adductors and SIJ on right.    Pertinent History  R THA on 10/19/2018, osteoporosis, arthritis, L thumb tendon transfer    Limitations  Walking;Lifting;House hold activities;Other (comment);Standing    Patient Stated Goals  I want to get back to going to the gym with my husband.    Currently in Pain?  Yes    Pain Score  2     Pain Orientation  Right    Pain Descriptors /  Indicators  Tender    Pain Type  Surgical pain                       [facility] Adult PT Treatment/Exercise - 11/03/18 0001      Ambulation/Gait   Ambulation/Gait  Yes    Ambulation Distance (Feet)  120 Feet    Assistive device  Straight cane    Gait Pattern  Step-through pattern;Decreased step length - right;Decreased stance time - right;Decreased weight shift to right   able to correct with focus   Ambulation Surface  Level;Indoor    Pre-Gait Activities  exaggerated step length/stride and focused increased stance time right      Knee/Hip Exercises: Aerobic   Nustep  L4/5: legs only 6.5 min       Knee/Hip Exercises: Supine   Short Arc Quad Sets  Strengthening;Right;1 set;5 reps    Bridges  10 reps    Straight Leg Raises  Right;10 reps;2 sets    Straight Leg Raises Limitations  quad lag or tight HS; back pain corrected with engagement of abs      Knee/Hip Exercises: Sidelying   Clams  RLE x 20 reps- tactile cues to not trwist from spine      Manual Therapy   Manual Therapy  Soft tissue  mobilization    Soft tissue mobilization  to right hip ADDuctors in supine; to gluteals in [facility]             PT Education - 11/03/18 1337    Education Details  HEP reviewed from hospital. Advised 1x/day since high number of exercises. She will continue low level TE if sitting in recliner. Also educated on use of ball for STW/MFR to gluteals and back.    Person(s) Educated  Patient    Methods  Explanation;Demonstration;Handout    Comprehension  Verbalized understanding;Returned demonstration       PT Short Term Goals - 10/30/18 1317      PT SHORT TERM GOAL #1   Title  Access need for shoe lift for L LE due to Leg Length discrepency.    Time  3    Period  Weeks    Status  Achieved    Target Date  11/17/18        PT Long Term Goals - 10/27/18 1019      PT LONG TERM GOAL #1   Title  Independent with HEP and progression    Time  6    Status  New    Target Date   12/08/18      PT LONG TERM GOAL #2   Title  R hip ROM WFL for noraml ADL's with no pain reported.    Time  6    Period  Weeks    Status  New      PT LONG TERM GOAL #3   Title  Pt will improve her R hip strength to grossly 4+/5 in order to improve gait and functional mobility.    Time  6    Period  Weeks    Status  New    Target Date  12/08/18      PT LONG TERM GOAL #4   Title  Pt will be able to amb community distances and on level/unlevel surfaces for >/ 2000 feet with no assistive device.    Baseline  pt amb with rolling walker short house hold distances    Time  6    Period  Weeks    Status  New    Target Date  12/08/18      PT LONG TERM GOAL #5   Title  pt will improve her FOTO score from 67% limitation to </= 47% limitation.    Baseline  67% limitaiton on 10/27/2018    Time  6    Period  Weeks    Status  New            Plan - 11/03/18 1338    Clinical Impression Statement  Patient doing well with TE and able to correct gait deviations with focus. Patient has TPs, tightness and tenderness in right hip ADDuctors and gluteals. Concerned with right gastroc soreness vs.clot. Patient having increased activity since last visit and right gastroc is tight; stretch issued. Seeing MD tomorrow.    Comorbidities  arthritis, osteoporosis, tendon transfer in L thumb    PT Treatment/Interventions  ADLs/Self Care Home Management;Cryotherapy;Electrical Stimulation;Ultrasound;Moist Heat;Therapeutic activities;Functional mobility training;Stair training;Gait training;Therapeutic exercise;Balance training;Neuromuscular re-education;Manual techniques;Patient/family education;Passive range of motion    PT Next Visit Plan  LE strengthening, gait, modalities as needed.  consolidate exercises from hospital and our new HEP; progress as tolerated.    PT Home Exercise Plan  Access Code: [email or web address]       Patient will benefit from skilled therapeutic intervention in order to  improve the following  deficits and impairments:  Difficulty walking, Abnormal gait, Pain, Postural dysfunction, Decreased strength, Increased edema, Decreased range of motion, Decreased activity tolerance, Decreased mobility  Visit Diagnosis: Pain in right hip  Muscle weakness (generalized)  Other abnormalities of gait and mobility     Problem List Patient Active Problem List   Diagnosis Date Noted  . Displaced fracture of right femoral neck ([place]) 10/18/2018  . Hip fracture ([place]) 10/18/2018    Madelyn Flavors PT 11/03/2018, 1:46 PM  [facility] [place], [place], [address] Phone: [phone]   Fax:  [phone]  Name: Carolyn [name] MRN: [id number] Date of Birth: 05-Aug-1950

## 2018-11-04 ENCOUNTER — Ambulatory Visit (INDEPENDENT_AMBULATORY_CARE_PROVIDER_SITE_OTHER): Payer: Medicare HMO

## 2018-11-04 ENCOUNTER — Ambulatory Visit (INDEPENDENT_AMBULATORY_CARE_PROVIDER_SITE_OTHER): Payer: Medicare HMO | Admitting: Orthopaedic Surgery

## 2018-11-04 DIAGNOSIS — M25551 Pain in right hip: Secondary | ICD-10-CM

## 2018-11-04 DIAGNOSIS — S72001A Fracture of unspecified part of neck of right femur, initial encounter for closed fracture: Secondary | ICD-10-CM

## 2018-11-04 NOTE — Progress Notes (Signed)
   Post-Op Visit Note   Patient: Carolyn [name]           Date of Birth: June 28, 1950           MRN: [id number] Visit Date: 11/04/2018 PCP: Geralynn Ochs, MD   Assessment & Plan:  Chief Complaint:  Chief Complaint  Patient presents with  . Right Hip - Pain   Visit Diagnoses:  1. Displaced fracture of right femoral neck ([facility])   2. Pain in right hip     Plan: Gitty is 2 weeks status post right total hip replacement for femoral neck fracture.  She is doing well.  She comes in today ambulating with a cane.  She is already in outpatient physical therapy.  She has no real complaints today.  Examination of the right hip shows a fully healed surgical scar.  She has mild postoperative swelling.  No evidence of seroma or infection.  She does have a small leg length discrepancy of approximately 1 cm  Today we remove the sutures in place Steri-Strips.  I have asked her to discontinue the Lovenox and begin aspirin 81 mg twice daily for the next 4 weeks.  She will continue with outpatient PT.  Prescription to [facility] was provided today for leg length discrepancy.  Follow-up in 4 weeks with two-view x-rays of the right hip.  Try to be treated  Follow-Up Instructions: Return in about 4 weeks (around 12/02/2018).   Orders:  Orders Placed This Encounter  Procedures  . XR HIP UNILAT W OR W/O PELVIS 2-3 VIEWS RIGHT   No orders of the defined types were placed in this encounter.   Imaging: Xr Hip Unilat W Or W/o Pelvis 2-3 Views Right  Result Date: 11/04/2018 Stable right total hip replacement without complication.   PMFS History: Patient Active Problem List   Diagnosis Date Noted  . Displaced fracture of right femoral neck ([place]) 10/18/2018  . Hip fracture ([place]) 10/18/2018   Past Medical History:  Diagnosis Date  . Arthritis    neck  . Complication of anesthesia   . Osteoporosis   . PONV (postoperative nausea and vomiting)   . Thumb pain, left     No family history on file.   Past Surgical History:  Procedure Laterality Date  . ABDOMINAL HYSTERECTOMY    . CESAREAN SECTION    . OOPHORECTOMY    . TENDON TRANSFER Left 02/07/2016   Procedure: LEFT THUMB TENDON TRANSFER;  Surgeon: Milly Jakob, MD;  Location: [place];  Service: Orthopedics;  Laterality: Left;  . TOTAL HIP ARTHROPLASTY Right 10/19/2018   Procedure: TOTAL HIP ARTHROPLASTY ANTERIOR APPROACH;  Surgeon: Leandrew Koyanagi, MD;  Location: [place];  Service: Orthopedics;  Laterality: Right;   Social History   Occupational History  . Not on file  Tobacco Use  . Smoking status: Never Smoker  . Smokeless tobacco: Never Used  Substance and Sexual Activity  . Alcohol use: Yes    Alcohol/week: 10.0 standard drinks    Types: 10 Glasses of wine per week    Comment: social  . Drug use: No  . Sexual activity: Not on file

## 2018-11-06 ENCOUNTER — Encounter: Payer: Medicare HMO | Admitting: Physical Therapy

## 2018-11-07 ENCOUNTER — Other Ambulatory Visit: Payer: Self-pay

## 2018-11-07 ENCOUNTER — Ambulatory Visit (INDEPENDENT_AMBULATORY_CARE_PROVIDER_SITE_OTHER): Payer: Medicare HMO | Admitting: Physical Therapy

## 2018-11-07 ENCOUNTER — Encounter: Payer: Self-pay | Admitting: Physical Therapy

## 2018-11-07 DIAGNOSIS — M6281 Muscle weakness (generalized): Secondary | ICD-10-CM

## 2018-11-07 DIAGNOSIS — R2689 Other abnormalities of gait and mobility: Secondary | ICD-10-CM | POA: Diagnosis not present

## 2018-11-07 DIAGNOSIS — M25551 Pain in right hip: Secondary | ICD-10-CM | POA: Diagnosis not present

## 2018-11-07 NOTE — Patient Instructions (Signed)
TENS UNIT: This is helpful for muscle pain and spasm.   Search and Purchase a TENS 7000 2nd edition at [email or web address]/. It should be less than $30.     TENS unit instructions: Do not shower or bathe with the unit on Turn the unit off before removing electrodes or batteries If the electrodes lose stickiness add a drop of water to the electrodes after they are disconnected from the unit and place on plastic sheet. If you continued to have difficulty, call the TENS unit company to purchase more electrodes. Do not apply lotion on the skin area prior to use. Make sure the skin is clean and dry as this will help prolong the life of the electrodes. After use, always check skin for unusual red areas, rash or other skin difficulties. If there are any skin problems, does not apply electrodes to the same area. Never remove the electrodes from the unit by pulling the wires. Do not use the TENS unit or electrodes other than as directed. Do not change electrode placement without consultating your therapist or physician. Keep 2 fingers with between each electrode. Wear time ratio is 2:1, on to off times.    For example on for 30 minutes off for 15 minutes and then on for 30 minutes off for 15 minutes

## 2018-11-07 NOTE — Therapy (Signed)
[place], [place], [address] Phone: [phone]   Fax:  [phone]  Physical Therapy Treatment  Patient Details  Name: Carolyn [name] MRN: [id number] Date of Birth: 1950-10-10 Referring Provider (PT): Aldine Contes, MD   Encounter Date: 11/07/2018  PT End of Session - 11/07/18 1259    Visit Number  4    Number of Visits  13    Date for PT Re-Evaluation  12/22/18    PT Start Time  1150    PT Stop Time  [id number]    PT Time Calculation (min)  45 min    Activity Tolerance  Patient tolerated treatment well;No increased pain    Behavior During Therapy  WFL for tasks assessed/performed       Past Medical History:  Diagnosis Date  . Arthritis    neck  . Complication of anesthesia   . Osteoporosis   . PONV (postoperative nausea and vomiting)   . Thumb pain, left     Past Surgical History:  Procedure Laterality Date  . ABDOMINAL HYSTERECTOMY    . CESAREAN SECTION    . OOPHORECTOMY    . TENDON TRANSFER Left 02/07/2016   Procedure: LEFT THUMB TENDON TRANSFER;  Surgeon: Milly Jakob, MD;  Location: [facility];  Service: Orthopedics;  Laterality: Left;  . 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  Subjective Assessment - 11/07/18 1344    Subjective  Pt reports she is only using cane with uneven surfaces or when she is unfamiliar surrounding.  Inner thigh remains tight. She overdid it with self massage one night; didn't sleep well. She  Is getting massage next wk and wants to know if she can lay on stomach.     Pertinent History  R THA on 10/19/2018, osteoporosis, arthritis, L thumb tendon transfer    Limitations  Walking;Lifting;House hold activities;Other (comment);Standing    Patient Stated  Goals  be able to drive husband to [place] with procedure in 2 wks.    Currently in Pain?  Yes    Pain Score  1     Pain Location  Hip    Pain Orientation  Right    Pain Descriptors / Indicators  Tightness;Sore    Aggravating Factors   prolonged sitting    Pain Relieving Factors  medication, rest         [facility] PT Assessment - 11/07/18 0001      Assessment   Medical Diagnosis  S72.001 closed displaced fx of R femoral neck    Referring Provider (PT)  Aldine Contes, MD    Onset Date/Surgical Date  10/19/18    Hand Dominance  Right    Prior Therapy  yes, for shoulder      Flexibility   Soft Tissue Assessment /Muscle Length  yes    Quadriceps  Rt: 105 deg in prone       [facility] Adult PT Treatment/Exercise - 11/07/18 0001      Ambulation/Gait   Ambulation Distance (Feet)  40 Feet    Assistive device  None    Gait Pattern  Step-through pattern;Decreased arm swing - left;Decreased dorsiflexion - right;Decreased weight shift to right;Decreased stance time - right    Pre-Gait Activities  VC for increased hip ext during Rt toe off  Knee/Hip Exercises: Stretches   Passive Hamstring Stretch  Right;2 reps;30 seconds   supine with strap   Quad Stretch  Right;3 reps;30 seconds   2 reps with PTA assist, 1 rep with strap assist   Quad Stretch Limitations  prone     Piriformis Stretch  Right;20 seconds;4 reps   2 supine, 2 seated   Piriformis Stretch Limitations  fig 4 and pulling knee towards opp shoulder    Other Knee/Hip Stretches  gentle Rt adductor stretch with strap and PTA assist, x 15 sec x 2 rep s    Other Knee/Hip Stretches  supine butterfly stretch x 20 sec x 2 reps       Knee/Hip Exercises: Supine   Bridges  --   1 rep     Knee/Hip Exercises: Prone   Hip Extension  Strengthening;Right;Left;1 set;5 reps    Other Prone Exercises  prone on elbows x 10 sec; 3 reps of prone press up x 10 sec   Reviewed ways to get in/out of prone position; pt verbalized understanding  and returned demo     Modalities   Modalities  Electrical Stimulation;Moist Heat      Moist Heat Therapy   Number Minutes Moist Heat  10 Minutes    Moist Heat Location  Hip   post hip, inner thigh     Electrical Stimulation   Electrical Stimulation Location  Rt piriformis/  Rt adductors     Electrical Stimulation Action  pre mod to each area     Electrical Stimulation Parameters  intensity to tolerance    Electrical Stimulation Goals  Pain      Manual Therapy   Soft tissue mobilization  STM to Rt posterior hip and adductors       NuStep L5 x 6 min - cues for RLE to remain in neutral and not adducted.        PT Education - 11/07/18 1307    Education Details  TENS info.    Person(s) Educated  Patient    Methods  Explanation;Handout    Comprehension  Verbalized understanding       PT Short Term Goals - 10/30/18 1317      PT SHORT TERM GOAL #1   Title  Access need for shoe lift for L LE due to Leg Length discrepency.    Time  3    Period  Weeks    Status  Achieved    Target Date  11/17/18        PT Long Term Goals - 10/27/18 1019      PT LONG TERM GOAL #1   Title  Independent with HEP and progression    Time  6    Status  New    Target Date  12/08/18      PT LONG TERM GOAL #2   Title  R hip ROM WFL for noraml ADL's with no pain reported.    Time  6    Period  Weeks    Status  New      PT LONG TERM GOAL #3   Title  Pt will improve her R hip strength to grossly 4+/5 in order to improve gait and functional mobility.    Time  6    Period  Weeks    Status  New    Target Date  12/08/18      PT LONG TERM GOAL #4   Title  Pt will be able to amb community distances and on  level/unlevel surfaces for >/ 2000 feet with no assistive device.    Baseline  pt amb with rolling walker short house hold distances    Time  6    Period  Weeks    Status  New    Target Date  12/08/18      PT LONG TERM GOAL #5   Title  pt will improve her FOTO score from 67% limitation  to </= 47% limitation.    Baseline  67% limitaiton on 10/27/2018    Time  6    Period  Weeks    Status  New            Plan - 11/07/18 1300    Clinical Impression Statement  Continued tightness in Rt adductors and piriformis.  Trial of prone exercises tolerated well.  Pt reported reduction of pain at end of session following estim/MHP.  Progressing well towards goals.    Comorbidities  arthritis, osteoporosis, tendon transfer in L thumb    PT Frequency  2x / week    PT Duration  6 weeks    PT Treatment/Interventions  ADLs/Self Care Home Management;Cryotherapy;Electrical Stimulation;Ultrasound;Moist Heat;Therapeutic activities;Functional mobility training;Stair training;Gait training;Therapeutic exercise;Balance training;Neuromuscular re-education;Manual techniques;Patient/family education;Passive range of motion    PT Next Visit Plan  LE strengthening, gait, modalities as needed. progress HEP as tolerated. possible TENS education    PT Home Exercise Plan  Access Code: [phone]       Patient will benefit from skilled therapeutic intervention in order to improve the following deficits and impairments:  Difficulty walking, Abnormal gait, Pain, Postural dysfunction, Decreased strength, Increased edema, Decreased range of motion, Decreased activity tolerance, Decreased mobility  Visit Diagnosis: Pain in right hip  Muscle weakness (generalized)  Other abnormalities of gait and mobility     Problem List Patient Active Problem List   Diagnosis Date Noted  . Displaced fracture of right femoral neck ([place]) 10/18/2018  . Hip fracture [facility]) 10/18/2018    Kerin Perna, PTA 11/07/18 2:09 PM  [place], [place], [address] Phone: [phone]   Fax:  [phone]  Name: Carolyn [name] MRN: [id number] Date of Birth: 26-Jun-1950

## 2018-11-10 ENCOUNTER — Other Ambulatory Visit: Payer: Self-pay

## 2018-11-10 ENCOUNTER — Ambulatory Visit (INDEPENDENT_AMBULATORY_CARE_PROVIDER_SITE_OTHER): Payer: Medicare HMO | Admitting: Physical Therapy

## 2018-11-10 ENCOUNTER — Encounter: Payer: Self-pay | Admitting: Physical Therapy

## 2018-11-10 DIAGNOSIS — M25551 Pain in right hip: Secondary | ICD-10-CM | POA: Diagnosis not present

## 2018-11-10 DIAGNOSIS — M6281 Muscle weakness (generalized): Secondary | ICD-10-CM

## 2018-11-10 DIAGNOSIS — R2689 Other abnormalities of gait and mobility: Secondary | ICD-10-CM | POA: Diagnosis not present

## 2018-11-10 NOTE — Therapy (Signed)
[place], [place], [address] Phone: [phone]   Fax:  [phone]  Physical Therapy Treatment  Patient Details  Name: Carolyn [name] MRN: [id number] Date of Birth: Mar 22, 1950 Referring Provider (PT): Aldine Contes, MD   Encounter Date: 11/10/2018  PT End of Session - 11/10/18 1104    Visit Number  5    Number of Visits  13    Date for PT Re-Evaluation  12/22/18    PT Start Time  1101    PT Stop Time  1200    PT Time Calculation (min)  59 min       Past Medical History:  Diagnosis Date  . Arthritis    neck  . Complication of anesthesia   . Osteoporosis   . PONV (postoperative nausea and vomiting)   . Thumb pain, left     Past Surgical History:  Procedure Laterality Date  . ABDOMINAL HYSTERECTOMY    . CESAREAN SECTION    . OOPHORECTOMY    . TENDON TRANSFER Left 02/07/2016   Procedure: LEFT THUMB TENDON TRANSFER;  Surgeon: Milly Jakob, MD;  Location: [facility];  Service: Orthopedics;  Laterality: Left;  . TOTAL HIP ARTHROPLASTY Right 10/19/2018   Procedure: TOTAL HIP ARTHROPLASTY ANTERIOR APPROACH;  Surgeon: Leandrew Koyanagi, MD;  Location: [facility];  Service: Orthopedics;  Laterality: Right;    There were no vitals filed for this visit.  Subjective Assessment - 11/10/18 1105    Subjective  Patient repors she is able to tie her own shoe now. She is still having medial thigh pain and knee pain med/lat on right knee.    Pertinent History  R THA on 10/19/2018, osteoporosis, arthritis, L thumb tendon transfer    Patient Stated Goals  be able to drive husband to [place] with procedure in 2 wks.    Currently in Pain?  Yes    Pain Score  2     Pain Location  Hip    Pain Orientation  Right    Pain Descriptors / Indicators  Tightness;Sore    Pain Type  Surgical pain                       [facility] Adult PT Treatment/Exercise - 11/10/18 0001      Knee/Hip  Exercises: Aerobic   Nustep  L5 x 6 min      Knee/Hip Exercises: Standing   Heel Raises  Both;10 reps    Hip Abduction  Stengthening;Right;2 sets;10 reps;Knee straight    Abduction Limitations  2 set with 2#    Lateral Step Up  Right;2 sets;10 reps;Hand Hold: 2;Step Height: 4";Step Height: 6"    Forward Step Up  Right;2 sets;10 reps;Hand Hold: 1;Step Height: 4";Step Height: 6"    Step Down  2 sets;10 reps;Hand Hold: 2;Step Height: 4"      Electrical Stimulation   Electrical Stimulation Location  right ant hip/knee and hip ADD    Electrical Stimulation Action  IFC x 10 min    Electrical Stimulation Parameters  to tolerance    Electrical Stimulation Goals  Pain      Manual Therapy   Manual Therapy  Myofascial release;Joint mobilization    Joint Mobilization  PA mobs gd II/III to lumbar vertebra    Myofascial Release  in SDLY to right QL; to right hip flexor in supine with active hip flexion  PT Short Term Goals - 10/30/18 1317      PT SHORT TERM GOAL #1   Title  Access need for shoe lift for L LE due to Leg Length discrepency.    Time  3    Period  Weeks    Status  Achieved    Target Date  11/17/18        PT Long Term Goals - 10/27/18 1019      PT LONG TERM GOAL #1   Title  Independent with HEP and progression    Time  6    Status  New    Target Date  12/08/18      PT LONG TERM GOAL #2   Title  R hip ROM WFL for noraml ADL's with no pain reported.    Time  6    Period  Weeks    Status  New      PT LONG TERM GOAL #3   Title  Pt will improve her R hip strength to grossly 4+/5 in order to improve gait and functional mobility.    Time  6    Period  Weeks    Status  New    Target Date  12/08/18      PT LONG TERM GOAL #4   Title  Pt will be able to amb community distances and on level/unlevel surfaces for >/ 2000 feet with no assistive device.    Baseline  pt amb with rolling walker short house hold distances    Time  6    Period  Weeks     Status  New    Target Date  12/08/18      PT LONG TERM GOAL #5   Title  pt will improve her FOTO score from 67% limitation to </= 47% limitation.    Baseline  67% limitaiton on 10/27/2018    Time  6    Period  Weeks    Status  New            Plan - 11/10/18 1326    Clinical Impression Statement  Patient reports continued pain in inner thigh and med/lat knee. C/o back pain yesterday. She has a tight right QL and HF. Good response to PA mobs although painful on right. eccentric quad weakness with step downs.    PT Frequency  2x / week    PT Duration  6 weeks    PT Treatment/Interventions  ADLs/Self Care Home Management;Cryotherapy;Electrical Stimulation;Ultrasound;Moist Heat;Therapeutic activities;Functional mobility training;Stair training;Gait training;Therapeutic exercise;Balance training;Neuromuscular re-education;Manual techniques;Patient/family education;Passive range of motion    PT Next Visit Plan  Continue with MFR/PA mobs as needed. LE strengthening, gait, modalities as needed. progress HEP as tolerated. possible TENS education    PT Home Exercise Plan  Access Code: [email or web address]    Consulted and Agree with Plan of Care  Patient       Patient will benefit from skilled therapeutic intervention in order to improve the following deficits and impairments:  Difficulty walking, Abnormal gait, Pain, Postural dysfunction, Decreased strength, Increased edema, Decreased range of motion, Decreased activity tolerance, Decreased mobility  Visit Diagnosis: Pain in right hip  Muscle weakness (generalized)  Other abnormalities of gait and mobility     Problem List Patient Active Problem List   Diagnosis Date Noted  . Displaced fracture of right femoral neck ([place]) 10/18/2018  . Hip fracture ([place]) 10/18/2018    Madelyn Flavors PT 11/10/2018, 1:30 PM  [facility] [id number]  [place], [place], [address] Phone: [phone]    Fax:  [phone]  Name: Carolyn [name] MRN: [id number] Date of Birth: [birth year]

## 2018-11-13 ENCOUNTER — Encounter: Payer: Self-pay | Admitting: Physical Therapy

## 2018-11-13 ENCOUNTER — Ambulatory Visit (INDEPENDENT_AMBULATORY_CARE_PROVIDER_SITE_OTHER): Payer: Medicare HMO | Admitting: Physical Therapy

## 2018-11-13 ENCOUNTER — Other Ambulatory Visit: Payer: Self-pay

## 2018-11-13 DIAGNOSIS — M6281 Muscle weakness (generalized): Secondary | ICD-10-CM

## 2018-11-13 DIAGNOSIS — M25551 Pain in right hip: Secondary | ICD-10-CM | POA: Diagnosis not present

## 2018-11-13 DIAGNOSIS — R2689 Other abnormalities of gait and mobility: Secondary | ICD-10-CM | POA: Diagnosis not present

## 2018-11-13 NOTE — Therapy (Signed)
[address] [place], [place], [address] Phone: [phone]   Fax:  [phone]  Physical Therapy Treatment  Patient Details  Name: Carolyn [name] MRN: [id number] Date of Birth: 12/17/1950 Referring Provider (PT): Aldine Contes, MD   Encounter Date: 11/13/2018  PT End of Session - 11/13/18 1115    Visit Number  6    Number of Visits  13    Date for PT Re-Evaluation  12/22/18    PT Start Time  1104    PT Stop Time  1150    PT Time Calculation (min)  46 min    Activity Tolerance  Patient tolerated treatment well    Behavior During Therapy  [facility] for tasks assessed/performed       Past Medical History:  Diagnosis Date  . Arthritis    neck  . Complication of anesthesia   . Osteoporosis   . PONV (postoperative nausea and vomiting)   . Thumb pain, left     Past Surgical History:  Procedure Laterality Date  . ABDOMINAL HYSTERECTOMY    . CESAREAN SECTION    . OOPHORECTOMY    . TENDON TRANSFER Left 02/07/2016   Procedure: LEFT THUMB TENDON TRANSFER;  Surgeon: Milly Jakob, MD;  Location: [facility];  Service: Orthopedics;  Laterality: Left;  . TOTAL HIP ARTHROPLASTY Right 10/19/2018   Procedure: TOTAL HIP ARTHROPLASTY ANTERIOR APPROACH;  Surgeon: Leandrew Koyanagi, MD;  Location: [facility];  Service: Orthopedics;  Laterality: Right;    There were no vitals filed for this visit.  Subjective Assessment - 11/13/18 1117    Subjective  Pt had full body massage with focus on Rt hip. Things feel better, but groin and inner thigh remain tight.    Pertinent History  R THA on 10/19/2018, osteoporosis, arthritis, L thumb tendon transfer    Patient Stated Goals  be able to drive husband to [place] with procedure in 2 wks.    Currently in Pain?  Yes    Pain Score  1     Pain Location  Groin    Pain Orientation  Right    Pain Descriptors / Indicators  Nagging    Aggravating Factors   prolonged sitting     Pain Relieving Factors  rest.         [facility] PT Assessment - 11/13/18 0001      Assessment   Medical Diagnosis  S72.001 closed displaced fx of R femoral neck    Referring Provider (PT)  Aldine Contes, MD    Onset Date/Surgical Date  10/19/18    Hand Dominance  Right    Prior Therapy  yes, for shoulder       [facility] Adult PT Treatment/Exercise - 11/13/18 0001      Lumbar Exercises: Quadruped   Other Quadruped Lumbar Exercises  quadruped leaning side to side for adductor stretch;  childs pose (slightly modified for pt comfort) x 15 sec; trial of 1 rep of fire hydrant each leg.       Knee/Hip Exercises: Stretches   Piriformis Stretch  Right;2 reps;20 seconds    Other Knee/Hip Stretches  standing L side lunge for Rt adductor stretch x 10 sec x 5 reps     Other Knee/Hip Stretches  fig 4 with downward pressure RLE x 20 sec x 2 reps       Knee/Hip Exercises: Aerobic   Stationary Bike  L1: 6 min       Knee/Hip Exercises: Standing  Lateral Step Up  Right;1 set;15 reps;Hand Hold: 1   3" step; slow and controlled movement    Forward Step Up  Right;1 set;15 reps;Hand Hold: 1   3" step; slow and controlled movement    Step Down  Left;1 set;15 reps;Hand Hold: 2   and retro step up with Rt      Modalities   Modalities  Electrical Stimulation      Moist Heat Therapy   Number Minutes Moist Heat  10 Minutes    Moist Heat Location  --   Rt adductors     Electrical Stimulation   Electrical Stimulation Location  Rt adductors     Electrical Stimulation Action  premod (upper/lower area)    Electrical Stimulation Parameters  intensity to tolerance     Electrical Stimulation Goals  Pain;Tone      Manual Therapy   Manual therapy comments  hooklying with Rt leg adducted (with support)     Soft tissue mobilization  STM to Rt adductor, including TPR with contract/ relax     Myofascial Release  Rt adductor               PT Short Term Goals - 10/30/18 1317      PT SHORT TERM  GOAL #1   Title  Access need for shoe lift for L LE due to Leg Length discrepency.    Time  3    Period  Weeks    Status  Achieved    Target Date  11/17/18        PT Long Term Goals - 10/27/18 1019      PT LONG TERM GOAL #1   Title  Independent with HEP and progression    Time  6    Status  New    Target Date  12/08/18      PT LONG TERM GOAL #2   Title  R hip ROM WFL for noraml ADL's with no pain reported.    Time  6    Period  Weeks    Status  New      PT LONG TERM GOAL #3   Title  Pt will improve her R hip strength to grossly 4+/5 in order to improve gait and functional mobility.    Time  6    Period  Weeks    Status  New    Target Date  12/08/18      PT LONG TERM GOAL #4   Title  Pt will be able to amb community distances and on level/unlevel surfaces for >/ 2000 feet with no assistive device.    Baseline  pt amb with rolling walker short house hold distances    Time  6    Period  Weeks    Status  New    Target Date  12/08/18      PT LONG TERM GOAL #5   Title  pt will improve her FOTO score from 67% limitation to </= 47% limitation.    Baseline  67% limitaiton on 10/27/2018    Time  6    Period  Weeks    Status  New            Plan - 11/13/18 1321    Clinical Impression Statement  Continued tightness in Rt adductor and groin.  Stair ascending continues to improve each session.  Pt reported reduction of tightness in hip after manual therapy; further reduction after estim/ MHP.  Progressing towards goals.  PT Frequency  2x / week    PT Duration  6 weeks    PT Treatment/Interventions  ADLs/Self Care Home Management;Cryotherapy;Electrical Stimulation;Ultrasound;Moist Heat;Therapeutic activities;Functional mobility training;Stair training;Gait training;Therapeutic exercise;Balance training;Neuromuscular re-education;Manual techniques;Patient/family education;Passive range of motion    PT Next Visit Plan  Continue with MFR/PA mobs as needed. LE strengthening,  gait, modalities as needed. progress HEP as tolerated. - Pt will be driving her husband to [place] on 10/27; may want to address this next visit.    PT Home Exercise Plan  Access Code: [email or web address]    Consulted and Agree with Plan of Care  Patient       Patient will benefit from skilled therapeutic intervention in order to improve the following deficits and impairments:  Difficulty walking, Abnormal gait, Pain, Postural dysfunction, Decreased strength, Increased edema, Decreased range of motion, Decreased activity tolerance, Decreased mobility  Visit Diagnosis: Pain in right hip  Muscle weakness (generalized)  Other abnormalities of gait and mobility     Problem List Patient Active Problem List   Diagnosis Date Noted  . Displaced fracture of right femoral neck ([place]) 10/18/2018  . Hip fracture [facility]) 10/18/2018   Kerin Perna, PTA 11/13/18 1:27 PM  [place], [place], [address] Phone: [phone]   Fax:  [phone]  Name: Carolyn [name] MRN: [id number] Date of Birth: [birth year]

## 2018-11-14 ENCOUNTER — Encounter: Payer: Self-pay | Admitting: Orthopaedic Surgery

## 2018-11-17 ENCOUNTER — Ambulatory Visit (INDEPENDENT_AMBULATORY_CARE_PROVIDER_SITE_OTHER): Payer: Medicare HMO | Admitting: Physical Therapy

## 2018-11-17 ENCOUNTER — Other Ambulatory Visit: Payer: Self-pay

## 2018-11-17 DIAGNOSIS — M25551 Pain in right hip: Secondary | ICD-10-CM

## 2018-11-17 DIAGNOSIS — R2689 Other abnormalities of gait and mobility: Secondary | ICD-10-CM

## 2018-11-17 DIAGNOSIS — M6281 Muscle weakness (generalized): Secondary | ICD-10-CM

## 2018-11-17 NOTE — Therapy (Signed)
[place], [place], [address] Phone: [phone]   Fax:  [phone]  Physical Therapy Treatment  Patient Details  Name: Carolyn [name] MRN: [id number] Date of Birth: 1950/11/16 Referring Provider (PT): Aldine Contes, MD   Encounter Date: 11/17/2018  PT End of Session - 11/17/18 1100    Visit Number  7    Number of Visits  13    Date for PT Re-Evaluation  12/22/18    PT Start Time  1101    PT Stop Time  1148    PT Time Calculation (min)  47 min    Activity Tolerance  Patient tolerated treatment well    Behavior During Therapy  [facility] for tasks assessed/performed       Past Medical History:  Diagnosis Date  . Arthritis    neck  . Complication of anesthesia   . Osteoporosis   . PONV (postoperative nausea and vomiting)   . Thumb pain, left     Past Surgical History:  Procedure Laterality Date  . ABDOMINAL HYSTERECTOMY    . CESAREAN SECTION    . OOPHORECTOMY    . TENDON TRANSFER Left 02/07/2016   Procedure: LEFT THUMB TENDON TRANSFER;  Surgeon: Milly Jakob, MD;  Location: [facility];  Service: Orthopedics;  Laterality: Left;  . 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  Subjective Assessment - 11/17/18 1101    Subjective  Groin is loosening up. Rolling it 2x/day. She drove around town this weekend and did fine with it. Grocery shopped and was able to cover the whole store. Still some soreness in right groin and distal medial HS.    Pertinent History  R THA on 10/19/2018, osteoporosis, arthritis, L thumb tendon transfer    Patient Stated Goals  be able to drive husband to [place] with procedure in 2 wks.    Currently in Pain?  Yes    Pain Score  1     Pain Location  Groin    Pain  Orientation  Right    Pain Descriptors / Indicators  Nagging    Pain Type  Surgical pain                       [facility] Adult PT Treatment/Exercise - 11/17/18 0001      Self-Care   Self-Care  Lifting    Lifting  lifting box onto waist high shelf simulating lifting suitcase into trunk. Body mechanics for squatting reviewed.      Lumbar Exercises: Quadruped   Other Quadruped Lumbar Exercises  quadruped leaning side to side for adductor stretch;  childs pose (slightly modified for pt comfort) x 15 sec;    Other Quadruped Lumbar Exercises  fire hydrant both 2 x 10      Knee/Hip Exercises: Stretches   Hip Flexor Stretch  Right;2 reps;60 seconds    Hip Flexor Stretch Limitations  feels into med knee    Other Knee/Hip Stretches  seated forward bend with legs ABDucted  for add stretch      Knee/Hip Exercises: Aerobic   Nustep  L5 x 6.27min      Knee/Hip Exercises: Standing   Lateral Step Up  Right;10 reps;2 sets;Hand Hold: 1;Step Height: 4";Step Height: 6"    Forward Step  Up  Right;1 set;10 reps;Step Height: 6"      Knee/Hip Exercises: Seated   Knee/Hip Flexion  blue band leg press simulating gas to break motion x 10 ea      Knee/Hip Exercises: Supine   Other Supine Knee/Hip Exercises  90/90 leg press left x 10 with blue band around feet for Rt isometric HF work       Knee/Hip Exercises: Sidelying   Clams  x 10 with green tband      Manual Therapy   Manual Therapy  Joint mobilization    Joint Mobilization  PA mobs bil L1-L5/S1               PT Short Term Goals - 10/30/18 1317      PT SHORT TERM GOAL #1   Title  Access need for shoe lift for L LE due to Leg Length discrepency.    Time  3    Period  Weeks    Status  Achieved    Target Date  11/17/18        PT Long Term Goals - 10/27/18 1019      PT LONG TERM GOAL #1   Title  Independent with HEP and progression    Time  6    Status  New    Target Date  12/08/18      PT LONG TERM GOAL #2   Title   R hip ROM WFL for noraml ADL's with no pain reported.    Time  6    Period  Weeks    Status  New      PT LONG TERM GOAL #3   Title  Pt will improve her R hip strength to grossly 4+/5 in order to improve gait and functional mobility.    Time  6    Period  Weeks    Status  New    Target Date  12/08/18      PT LONG TERM GOAL #4   Title  Pt will be able to amb community distances and on level/unlevel surfaces for >/ 2000 feet with no assistive device.    Baseline  pt amb with rolling walker short house hold distances    Time  6    Period  Weeks    Status  New    Target Date  12/08/18      PT LONG TERM GOAL #5   Title  pt will improve her FOTO score from 67% limitation to </= 47% limitation.    Baseline  67% limitaiton on 10/27/2018    Time  6    Period  Weeks    Status  New            Plan - 11/17/18 1306    Clinical Impression Statement  Patient tolerated new TE well. She requires VCs to keep right knee aligned with step ups/downs. Decreased PA mobility due to tight muscles right > left. Improved after mobs. Good mechanics lifting today with cues from PT to widen stance and stick bottom out.    Comorbidities  arthritis, osteoporosis, tendon transfer in L thumb    PT Treatment/Interventions  ADLs/Self Care Home Management;Cryotherapy;Electrical Stimulation;Ultrasound;Moist Heat;Therapeutic activities;Functional mobility training;Stair training;Gait training;Therapeutic exercise;Balance training;Neuromuscular re-education;Manual techniques;Patient/family education;Passive range of motion    PT Next Visit Plan  Continue with MFR/PA mobs as needed.LE strengthening, gait, modalities as needed. progress HEP as tolerated.    PT Home Exercise Plan  Access Code: [email or web address]  Patient will benefit from skilled therapeutic intervention in order to improve the following deficits and impairments:  Difficulty walking, Abnormal gait, Pain, Postural dysfunction, Decreased strength, Increased  edema, Decreased range of motion, Decreased activity tolerance, Decreased mobility  Visit Diagnosis: Muscle weakness (generalized)  Other abnormalities of gait and mobility  Pain in right hip     Problem List Patient Active Problem List   Diagnosis Date Noted  . Displaced fracture of right femoral neck ([place]) 10/18/2018  . Hip fracture ([place]) 10/18/2018    Madelyn Flavors PT 11/17/2018, 1:12 PM  [facility] [place], [place], [address] Phone: [phone]   Fax:  [phone]  Name: Carolyn [name] MRN: [id number] Date of Birth: [birth year]

## 2018-11-20 ENCOUNTER — Encounter: Payer: Medicare HMO | Admitting: Physical Therapy

## 2018-11-21 ENCOUNTER — Other Ambulatory Visit: Payer: Self-pay

## 2018-11-21 ENCOUNTER — Ambulatory Visit (INDEPENDENT_AMBULATORY_CARE_PROVIDER_SITE_OTHER): Payer: Medicare HMO | Admitting: Physical Therapy

## 2018-11-21 ENCOUNTER — Encounter: Payer: Self-pay | Admitting: Physical Therapy

## 2018-11-21 DIAGNOSIS — M6281 Muscle weakness (generalized): Secondary | ICD-10-CM | POA: Diagnosis not present

## 2018-11-21 DIAGNOSIS — R2689 Other abnormalities of gait and mobility: Secondary | ICD-10-CM | POA: Diagnosis not present

## 2018-11-21 DIAGNOSIS — M25551 Pain in right hip: Secondary | ICD-10-CM | POA: Diagnosis not present

## 2018-11-21 NOTE — Therapy (Signed)
[place], [place], [address] Phone: [phone]   Fax:  [phone]  Physical Therapy Treatment  Patient Details  Name: Carolyn [name] MRN: [id number] Date of Birth: Mar 16, 1950 Referring Provider (PT): Aldine Contes, MD   Encounter Date: 11/21/2018  PT End of Session - 11/21/18 1405    Visit Number  8    Number of Visits  13    Date for PT Re-Evaluation  12/22/18    PT Start Time  6962    PT Stop Time  1451    PT Time Calculation (min)  46 min    Activity Tolerance  Patient tolerated treatment well    Behavior During Therapy  [facility] for tasks assessed/performed       Past Medical History:  Diagnosis Date  . Arthritis    neck  . Complication of anesthesia   . Osteoporosis   . PONV (postoperative nausea and vomiting)   . Thumb pain, left     Past Surgical History:  Procedure Laterality Date  . ABDOMINAL HYSTERECTOMY    . CESAREAN SECTION    . OOPHORECTOMY    . TENDON TRANSFER Left 02/07/2016   Procedure: LEFT THUMB TENDON TRANSFER;  Surgeon: Milly Jakob, MD;  Location: [place];  Service: Orthopedics;  Laterality: Left;  . TOTAL HIP ARTHROPLASTY Right 10/19/2018   Procedure: TOTAL HIP ARTHROPLASTY ANTERIOR APPROACH;  Surgeon: Leandrew Koyanagi, MD;  Location: [place];  Service: Orthopedics;  Laterality: Right;    There were no vitals filed for this visit.  Subjective Assessment - 11/21/18 1405    Subjective  Drove home from [place] this week, "hip was a little twangy".  Pt pleased with progress so far.    Currently in Pain?  Yes    Pain Score  2     Pain Location  Hip    Pain Orientation  Anterior;Posterior    Pain Descriptors / Indicators  Nagging    Aggravating Factors   getting up after prolonged sitting    Pain Relieving Factors  stretching         [facility] PT Assessment - 11/21/18 0001      Assessment   Medical Diagnosis  S72.001 closed displaced fx of R  femoral neck    Referring Provider (PT)  Aldine Contes, MD    Onset Date/Surgical Date  10/19/18    Hand Dominance  Right    Next MD Visit  12/03/18    Prior Therapy  yes, for shoulder      Flexibility   Quadriceps  Rt: 124 degrees (3rd rep of quad stretch - initially 110 deg)       [facility] Adult PT Treatment/Exercise - 11/21/18 0001      Self-Care   Self-Care  Scar Mobilizations    Scar Mobilizations  Pt educated on scar mobilizations; pt verbalized understanding      Knee/Hip Exercises: Stretches   Passive Hamstring Stretch  Right;2 reps;20 seconds    Quad Stretch  Right;3 reps;30 seconds   2 reps with strap assist 30 sec   Quad Stretch Limitations  prone     Hip Flexor Stretch  Right;20 seconds;2 reps   warrior 1 pose   Hip Flexor Stretch Limitations  standing    Piriformis Stretch  Right;20 seconds;1 rep    Piriformis Stretch Limitations  figure 4 in sitting     Other Knee/Hip Stretches  Butterfly stretch in supine( 2 rep ,30 sec) adductor stretch with  strap in supine ( 2 reps, 20 sec    Other Knee/Hip Stretches  childs pose x 20 sec       Knee/Hip Exercises: Aerobic   Nustep  L5-4 x 22mn      Moist Heat Therapy   Number Minutes Moist Heat  10 Minutes    Moist Heat Location  --   Rt adductors/ post hip     Electrical Stimulation   Electrical Stimulation Location  Rt adductors, post Rt hip     Electrical Stimulation Action  premod to each area     Electrical Stimulation Parameters  to tolerance    Electrical Stimulation Goals  Pain;Tone      Manual Therapy   Manual Therapy  Soft tissue mobilization    Soft tissue mobilization  STM to Rt adductor, including TPR with contract/ relax ; Rt glute and hip rotators with passive / active IR/ER of LE    Myofascial Release  Rt adductor               PT Short Term Goals - 10/30/18 1317      PT SHORT TERM GOAL #1   Title  Access need for shoe lift for L LE due to Leg Length discrepency.    Time  3    Period   Weeks    Status  Achieved    Target Date  11/17/18        PT Long Term Goals - 11/21/18 1558      PT LONG TERM GOAL #1   Title  Independent with HEP and progression    Time  6    Status  Partially Met      PT LONG TERM GOAL #2   Title  R hip ROM WFL for normal ADL's with no pain reported.    Time  6    Period  Weeks    Status  Partially Met      PT LONG TERM GOAL #3   Title  Pt will improve her R hip strength to grossly 4+/5 in order to improve gait and functional mobility.    Time  6    Period  Weeks    Status  On-going      PT LONG TERM GOAL #4   Title  Pt will be able to amb community distances and on level/unlevel surfaces for >/ 2000 feet with no assistive device.    Baseline  per pt report - pt able to ambulate >2000 ft without difficulty.    Time  6    Period  Weeks    Status  Achieved      PT LONG TERM GOAL #5   Title  pt will improve her FOTO score from 67% limitation to </= 47% limitation.    Baseline  67% limitaiton on 10/27/2018    Time  6    Period  Weeks    Status  On-going            Plan - 11/21/18 1504    Clinical Impression Statement  Pt reporting overall improvement in mobility and pain.  Pt able to complete SLR on RLE without pain now.  Improved Rt quad flexibility since last assessment. Rt adductors continue to be tight and tender, but gradually improving. Pt making good progress towards all goals. Anticipate d/c in next few visits.    Comorbidities  arthritis, osteoporosis, tendon transfer in L thumb    Rehab Potential  Good    PT Frequency  2x / week    PT Duration  6 weeks    PT Treatment/Interventions  ADLs/Self Care Home Management;Cryotherapy;Electrical Stimulation;Ultrasound;Moist Heat;Therapeutic activities;Functional mobility training;Stair training;Gait training;Therapeutic exercise;Balance training;Neuromuscular re-education;Manual techniques;Patient/family education;Passive range of motion    PT Next Visit Plan  Continue with  MFR/PA mobs as needed.LE strengthening, gait, modalities as needed. progress HEP as tolerated.  Consolidate HEP exercises.    PT Home Exercise Plan  Access Code: [email or web address]       Patient will benefit from skilled therapeutic intervention in order to improve the following deficits and impairments:  Difficulty walking, Abnormal gait, Pain, Postural dysfunction, Decreased strength, Increased edema, Decreased range of motion, Decreased activity tolerance, Decreased mobility  Visit Diagnosis: Muscle weakness (generalized)  Other abnormalities of gait and mobility  Pain in right hip     Problem List Patient Active Problem List   Diagnosis Date Noted  . Displaced fracture of right femoral neck ([place]) 10/18/2018  . Hip fracture [facility]) 10/18/2018   Kerin Perna, PTA 11/21/18 4:28 PM  [place] [facility] [place], [place], [address] Phone: [phone]   Fax:  [phone]  Name: Carolyn [name] MRN: [id number] Date of Birth: December 10, 1950

## 2018-11-24 ENCOUNTER — Encounter: Payer: Medicare HMO | Admitting: Physical Therapy

## 2018-11-25 ENCOUNTER — Other Ambulatory Visit: Payer: Self-pay

## 2018-11-25 ENCOUNTER — Ambulatory Visit (INDEPENDENT_AMBULATORY_CARE_PROVIDER_SITE_OTHER): Payer: Medicare HMO | Admitting: Physical Therapy

## 2018-11-25 ENCOUNTER — Encounter: Payer: Self-pay | Admitting: Physical Therapy

## 2018-11-25 DIAGNOSIS — M25551 Pain in right hip: Secondary | ICD-10-CM | POA: Diagnosis not present

## 2018-11-25 DIAGNOSIS — M6281 Muscle weakness (generalized): Secondary | ICD-10-CM | POA: Diagnosis not present

## 2018-11-25 DIAGNOSIS — R2689 Other abnormalities of gait and mobility: Secondary | ICD-10-CM

## 2018-11-25 NOTE — Therapy (Signed)
[place], [place], [address] Phone: [phone]   Fax:  [phone]  Physical Therapy Treatment  Patient Details  Name: Carolyn [name] MRN: [id number] Date of Birth: 21-Oct-1950 Referring Provider (PT): Aldine Contes, MD   Encounter Date: 11/25/2018  PT End of Session - 11/25/18 0933    Visit Number  9    Number of Visits  13    Date for PT Re-Evaluation  12/22/18    PT Start Time  0933    PT Stop Time  1022    PT Time Calculation (min)  49 min    Activity Tolerance  Patient tolerated treatment well    Behavior During Therapy  [facility] for tasks assessed/performed       Past Medical History:  Diagnosis Date  . Arthritis    neck  . Complication of anesthesia   . Osteoporosis   . PONV (postoperative nausea and vomiting)   . Thumb pain, left     Past Surgical History:  Procedure Laterality Date  . ABDOMINAL HYSTERECTOMY    . CESAREAN SECTION    . OOPHORECTOMY    . TENDON TRANSFER Left 02/07/2016   Procedure: LEFT THUMB TENDON TRANSFER;  Surgeon: Milly Jakob, MD;  Location: [facility];  Service: Orthopedics;  Laterality: Left;  . TOTAL HIP ARTHROPLASTY Right 10/19/2018   Procedure: TOTAL HIP ARTHROPLASTY ANTERIOR APPROACH;  Surgeon: Leandrew Koyanagi, MD;  Location: [facility];  Service: Orthopedics;  Laterality: Right;    There were no vitals filed for this visit.  Subjective Assessment - 11/25/18 0934    Subjective  Patient drove to [facility] and back yesterday with no problems. Still feeling heaviness in medial right thigh and general ache in anterior hip.    Pertinent History  R THA on 10/19/2018, osteoporosis, arthritis, L thumb tendon transfer    Patient Stated Goals  be able to get up off floor without support    Currently in Pain?  Yes    Pain Score  1     Pain Location  Hip    Pain Orientation  Anterior    Pain Descriptors / Indicators  Aching                        [facility] Adult PT Treatment/Exercise - 11/25/18 0001      Self-Care   Other Self-Care Comments   Reviewed current HEP and eliminated lower level TE while adding and modifying other execises. Advised patient to continue with all stretches and divide strengthening exercises into manageable times of about 30 min.      Lumbar Exercises: Quadruped   Straight Leg Raise  10 reps    Other Quadruped Lumbar Exercises  quad elbow to same side knee x 5 each with tactile cues for stable pelvis      Knee/Hip Exercises: Stretches   Active Hamstring Stretch  Right;5 reps    Active Hamstring Stretch Limitations  with strap and active quad contraction      Knee/Hip Exercises: Aerobic   Nustep  L5 x 6 min      Knee/Hip Exercises: Standing   Forward Lunges  Both;1 set;10 reps    Forward Lunges Limitations  standing lunge     Other Standing Knee Exercises  climbing down to floor and then standing up from functional squat position without UE support      Knee/Hip Exercises: Seated   Other Seated Knee/Hip Exercises  seated  nerve glide RLE x 5   pt doing similar exercise in quadriped     Knee/Hip Exercises: Supine   Bridges with Clamshell  Strengthening;Both;10 reps   green band     Knee/Hip Exercises: Sidelying   Clams  x10 green bil               PT Short Term Goals - 10/30/18 1317      PT SHORT TERM GOAL #1   Title  Access need for shoe lift for L LE due to Leg Length discrepency.    Time  3    Period  Weeks    Status  Achieved    Target Date  11/17/18        PT Long Term Goals - 11/21/18 1558      PT LONG TERM GOAL #1   Title  Independent with HEP and progression    Time  6    Status  Partially Met      PT LONG TERM GOAL #2   Title  R hip ROM WFL for normal ADL's with no pain reported.    Time  6    Period  Weeks    Status  Partially Met      PT LONG TERM GOAL #3   Title  Pt will improve her R hip strength to grossly 4+/5 in order to  improve gait and functional mobility.    Time  6    Period  Weeks    Status  On-going      PT LONG TERM GOAL #4   Title  Pt will be able to amb community distances and on level/unlevel surfaces for >/ 2000 feet with no assistive device.    Baseline  per pt report - pt able to ambulate >2000 ft without difficulty.    Time  6    Period  Weeks    Status  Achieved      PT LONG TERM GOAL #5   Title  pt will improve her FOTO score from 67% limitation to </= 47% limitation.    Baseline  67% limitaiton on 10/27/2018    Time  6    Period  Weeks    Status  On-going            Plan - 11/25/18 1555    Clinical Impression Statement  Patient is doing very well overall. We went though all of her exercises today and eliminated, modified or kept them to decrease total time of HEP. She has + neural tension on the RLE and we did some work with nerve glides as well and determining the difference between a nerve pull and muscle stretch. She continues to have difficulty with floor to stand transfer without UE support as well as weakness in bil hip ER and glut med as evidenced on stairs and with lunges. She will benefit from further strengthening in these areas but may be able to decrease frequency of PT visits.    Comorbidities  arthritis, osteoporosis, tendon transfer in L thumb    PT Treatment/Interventions  ADLs/Self Care Home Management;Cryotherapy;Electrical Stimulation;Ultrasound;Moist Heat;Therapeutic activities;Functional mobility training;Stair training;Gait training;Therapeutic exercise;Balance training;Neuromuscular re-education;Manual techniques;Patient/family education;Passive range of motion    PT Next Visit Plan  10th visit PN and FOTO; Discuss decreased frequency; continue to work on functional lunges, floor to stand transfers and steps. Monitor neural tension RLE.    PT Home Exercise Plan  Access Code: [email or web address]       Patient will benefit from  skilled therapeutic intervention in order to  improve the following deficits and impairments:  Difficulty walking, Abnormal gait, Pain, Postural dysfunction, Decreased strength, Increased edema, Decreased range of motion, Decreased activity tolerance, Decreased mobility  Visit Diagnosis: Muscle weakness (generalized)  Other abnormalities of gait and mobility  Pain in right hip     Problem List Patient Active Problem List   Diagnosis Date Noted  . Displaced fracture of right femoral neck ([place]) 10/18/2018  . Hip fracture ([place]) 10/18/2018    Madelyn Flavors PT 11/25/2018, 6:43 PM  [facility] [place], [place], [address] Phone: [phone]   Fax:  [phone]  Name: Carolyn [name] MRN: [id number] Date of Birth: 1950-02-28

## 2018-11-27 ENCOUNTER — Encounter: Payer: Medicare HMO | Admitting: Physical Therapy

## 2018-11-28 ENCOUNTER — Other Ambulatory Visit: Payer: Self-pay

## 2018-11-28 ENCOUNTER — Encounter: Payer: Self-pay | Admitting: Physical Therapy

## 2018-11-28 ENCOUNTER — Ambulatory Visit (INDEPENDENT_AMBULATORY_CARE_PROVIDER_SITE_OTHER): Payer: Medicare HMO | Admitting: Physical Therapy

## 2018-11-28 DIAGNOSIS — R2689 Other abnormalities of gait and mobility: Secondary | ICD-10-CM | POA: Diagnosis not present

## 2018-11-28 DIAGNOSIS — M6281 Muscle weakness (generalized): Secondary | ICD-10-CM | POA: Diagnosis not present

## 2018-11-28 DIAGNOSIS — M25551 Pain in right hip: Secondary | ICD-10-CM

## 2018-11-28 NOTE — Patient Instructions (Signed)
Adductor Stretch, Standing: Financial controller (Chair)    *hand on hip, other hand onto chair.   * food under chair.   Press into outer heels, rotate front knee to line up with toes. Feet 2-3 ft apart, heels aligned front to back, rotate at hips. Bottom hand on chair, reach top arm toward ceiling. Keep leg muscles engaged, knees neither hyper-extended nor wobbly. Hold for ____ breaths. Repeat ____ times each side.  Copyright  VHI. All rights reserved.

## 2018-11-28 NOTE — Therapy (Signed)
[place], [place], [address] Phone: [phone]   Fax:  [phone]  Physical Therapy Treatment and 10th visit Progress Note  Patient Details  Name: Carolyn [name] MRN: [id number] Date of Birth: 1950/05/09 Referring Provider (PT): Aldine Contes, MD   Encounter Date: 11/28/2018  PT End of Session - 11/28/18 1322    Visit Number  10    Number of Visits  13    Date for PT Re-Evaluation  12/22/18    PT Start Time  1320    PT Stop Time  5631    PT Time Calculation (min)  33 min    Activity Tolerance  Patient tolerated treatment well;No increased pain    Behavior During Therapy  WFL for tasks assessed/performed       Past Medical History:  Diagnosis Date  . Arthritis    neck  . Complication of anesthesia   . Osteoporosis   . PONV (postoperative nausea and vomiting)   . Thumb pain, left     Past Surgical History:  Procedure Laterality Date  . ABDOMINAL HYSTERECTOMY    . CESAREAN SECTION    . OOPHORECTOMY    . TENDON TRANSFER Left 02/07/2016   Procedure: LEFT THUMB TENDON TRANSFER;  Surgeon: Milly Jakob, MD;  Location: [facility];  Service: Orthopedics;  Laterality: Left;  . TOTAL HIP ARTHROPLASTY Right 10/19/2018   Procedure: TOTAL HIP ARTHROPLASTY ANTERIOR APPROACH;  Surgeon: Leandrew Koyanagi, MD;  Location: [facility];  Service: Orthopedics;  Laterality: Right;    There were no vitals filed for this visit.  Subjective Assessment - 11/28/18 1322    Subjective  Pt reports she has been working through the new exercises; is now doing "functional exercises" every other day, and does stretches on opp days.    Pertinent History  R THA on 10/19/2018, osteoporosis, arthritis, L thumb tendon transfer    Patient Stated Goals  be able to get up off floor without support    Currently in Pain?  No/denies    Pain Score  0-No pain         [facility] PT Assessment - 11/28/18 0001      Assessment   Medical Diagnosis  S72.001 closed displaced fx of R femoral neck    Referring Provider (PT)  Aldine Contes, MD    Onset Date/Surgical Date  10/19/18    Hand Dominance  Right    Next MD Visit  12/03/18    Prior Therapy  yes, for shoulder      Observation/Other Assessments   Focus on Therapeutic Outcomes (FOTO)   28% limitation.       Strength   Strength Assessment Site  Hip    Right/Left Hip  Right    Right Hip Flexion  --   5-/5   Right Hip Extension  4+/5    Right Hip ABduction  5/5    Right Hip ADduction  4+/5         [facility] Adult PT Treatment/Exercise - 11/28/18 0001      Lumbar Exercises: Quadruped   Other Quadruped Lumbar Exercises  quad elbow to same side knee then leg ext x 5 each with tactile cues for stable pelvis      Knee/Hip Exercises: Stretches   Piriformis Stretch  Right;2 reps;20 seconds   fig 4 in sitting   Other Knee/Hip Stretches  modified triangle pose x 15 sec each side per HEP. demo and tactile cues to correct  form      Knee/Hip Exercises: Aerobic   Nustep  L4: 5.5 min       Knee/Hip Exercises: Standing   Other Standing Knee Exercises  split squat with partial depth, UE on counter with 3 pulses x 5 reps each    shown split squat progression     Modalities   Modalities  --   pt declined.     Manual Therapy   Soft tissue mobilization  STM to Rt adductor and medial hamstring, including TPR with contract/ relax; STM to lateral Rt quad     Myofascial Release  Rt adductor      verbally reviewed other exercises of HEP.          PT Short Term Goals - 10/30/18 1317      PT SHORT TERM GOAL #1   Title  Access need for shoe lift for L LE due to Leg Length discrepency.    Time  3    Period  Weeks    Status  Achieved    Target Date  11/17/18        PT Long Term Goals - 11/28/18 1411      PT LONG TERM GOAL #1   Title  Independent with HEP and progression    Time  6    Status  Partially Met      PT LONG TERM GOAL #2    Title  R hip ROM WFL for normal ADL's with no pain reported.    Time  6    Period  Weeks    Status  Achieved      PT LONG TERM GOAL #3   Title  Pt will improve her R hip strength to grossly 4+/5 in order to improve gait and functional mobility.    Time  6    Period  Weeks    Status  Achieved      PT LONG TERM GOAL #4   Title  Pt will be able to amb community distances and on level/unlevel surfaces for >/ 2000 feet with no assistive device.    Baseline  per pt report - pt able to ambulate >2000 ft without difficulty.    Time  6    Period  Weeks    Status  Achieved      PT LONG TERM GOAL #5   Title  pt will improve her FOTO score from 67% limitation to </= 47% limitation.    Time  6    Period  Weeks    Status  Achieved            Plan - 11/28/18 1425    Clinical Impression Statement  Pt demonstrated improved hip strength and FOTO score.  Pt tolerated all new exercises well, without increase in discomfort.  Has met all goals except LTG#1; will decrease frequency while pt works on [employer].    Comorbidities  arthritis, osteoporosis, tendon transfer in L thumb    Rehab Potential  Good    PT Frequency  2x / week    PT Duration  6 weeks    PT Treatment/Interventions  ADLs/Self Care Home Management;Cryotherapy;Electrical Stimulation;Ultrasound;Moist Heat;Therapeutic activities;Functional mobility training;Stair training;Gait training;Therapeutic exercise;Balance training;Neuromuscular re-education;Manual techniques;Patient/family education;Passive range of motion    PT Next Visit Plan  assess response to HEP; continue to work on functional lunges, floor to stand transfers and steps. Monitor neural tension RLE.    PT Home Exercise Plan  Access Code: [email or web address]    Consulted and  Agree with Plan of Care  Patient       Patient will benefit from skilled therapeutic intervention in order to improve the following deficits and impairments:  Difficulty walking, Abnormal gait, Pain, Postural  dysfunction, Decreased strength, Increased edema, Decreased range of motion, Decreased activity tolerance, Decreased mobility  Visit Diagnosis: Muscle weakness (generalized)  Other abnormalities of gait and mobility  Pain in right hip    Physical Therapy Progress Note   Dates of Reporting Period: 10/27/2018-11/28/2018   Objective Measurements: see above  Goal Update: see above    Reason Skilled Services are Required: continue to develop HEP progression.  Thank you for the referral of this patient. Rudell Cobb, MPT   Problem List Patient Active Problem List   Diagnosis Date Noted  . Displaced fracture of right femoral neck ([place]) 10/18/2018  . Hip fracture [facility]) 10/18/2018    Kerin Perna, PTA 11/28/18 2:56 PM  [place], [place], [address] Phone: [phone]   Fax:  [phone]  Name: Carolyn [name] MRN: [id number] Date of Birth: December 11, 1950

## 2018-12-01 ENCOUNTER — Encounter: Payer: Medicare HMO | Admitting: Physical Therapy

## 2018-12-03 ENCOUNTER — Ambulatory Visit (INDEPENDENT_AMBULATORY_CARE_PROVIDER_SITE_OTHER): Payer: Medicare HMO

## 2018-12-03 ENCOUNTER — Encounter: Payer: Self-pay | Admitting: Orthopaedic Surgery

## 2018-12-03 ENCOUNTER — Other Ambulatory Visit: Payer: Self-pay

## 2018-12-03 ENCOUNTER — Ambulatory Visit: Payer: Medicare HMO | Admitting: Orthopaedic Surgery

## 2018-12-03 DIAGNOSIS — S72001A Fracture of unspecified part of neck of right femur, initial encounter for closed fracture: Secondary | ICD-10-CM

## 2018-12-03 NOTE — Progress Notes (Signed)
   Post-Op Visit Note   Patient: Carolyn [name]           Date of Birth: 1950-02-21           MRN: [id number] Visit Date: 12/03/2018 PCP: Carolyn Ochs, MD   Assessment & Plan:  Chief Complaint:  Chief Complaint  Patient presents with  . Right Hip - Pain, Follow-up, Routine Post Op   Visit Diagnoses:  1. Displaced fracture of right femoral neck ([place])     Plan: Carolyn [name] is 6 weeks status post right total hip replacement for a femoral neck fracture.  She is doing well.  She has been steadily increasing her activity and mobility.  She is doing well overall.  She feels some soreness in her hip muscles.  She denies any pain.  Surgical scar is healed without signs of infection.  The suture knot was removed without difficulty.  No signs of infection.  X-rays are unremarkable.  Carolyn [name] is doing very well for her 6-week follow-up.  We will see her back in another 6 weeks.  No x-rays needed if she is not having any issues.  She will continue to increase activity mobility as tolerated.  Follow-Up Instructions: Return in about 6 weeks (around 01/14/2019).   Orders:  Orders Placed This Encounter  Procedures  . XR HIP UNILAT W OR W/O PELVIS 2-3 VIEWS RIGHT   No orders of the defined types were placed in this encounter.   Imaging: Xr Hip Unilat W Or W/o Pelvis 2-3 Views Right  Result Date: 12/03/2018 Stable right total hip replacement without complication.   PMFS History: Patient Active Problem List   Diagnosis Date Noted  . Displaced fracture of right femoral neck ([place]) 10/18/2018  . Hip fracture ([place]) 10/18/2018   Past Medical History:  Diagnosis Date  . Arthritis    neck  . Complication of anesthesia   . Osteoporosis   . PONV (postoperative nausea and vomiting)   . Thumb pain, left     History reviewed. No pertinent family history.  Past Surgical History:  Procedure Laterality Date  . ABDOMINAL HYSTERECTOMY    . CESAREAN SECTION    . OOPHORECTOMY    . TENDON TRANSFER  Left 02/07/2016   Procedure: LEFT THUMB TENDON TRANSFER;  Surgeon: Milly Jakob, MD;  Location: [facility];  Service: Orthopedics;  Laterality: Left;  . TOTAL HIP ARTHROPLASTY Right 10/19/2018   Procedure: TOTAL HIP ARTHROPLASTY ANTERIOR APPROACH;  Surgeon: Leandrew Koyanagi, MD;  Location: [facility];  Service: Orthopedics;  Laterality: Right;   Social History   Occupational History  . Not on file  Tobacco Use  . Smoking status: Never Smoker  . Smokeless tobacco: Never Used  Substance and Sexual Activity  . Alcohol use: Yes    Alcohol/week: 10.0 standard drinks    Types: 10 Glasses of wine per week    Comment: social  . Drug use: No  . Sexual activity: Not on file

## 2018-12-04 ENCOUNTER — Encounter: Payer: Medicare HMO | Admitting: Physical Therapy

## 2018-12-08 ENCOUNTER — Encounter: Payer: Medicare HMO | Admitting: Physical Therapy

## 2018-12-11 ENCOUNTER — Encounter: Payer: Self-pay | Admitting: Physical Therapy

## 2018-12-11 ENCOUNTER — Ambulatory Visit (INDEPENDENT_AMBULATORY_CARE_PROVIDER_SITE_OTHER): Payer: Medicare HMO | Admitting: Physical Therapy

## 2018-12-11 ENCOUNTER — Other Ambulatory Visit: Payer: Self-pay

## 2018-12-11 DIAGNOSIS — R2689 Other abnormalities of gait and mobility: Secondary | ICD-10-CM | POA: Diagnosis not present

## 2018-12-11 DIAGNOSIS — M6281 Muscle weakness (generalized): Secondary | ICD-10-CM | POA: Diagnosis not present

## 2018-12-11 NOTE — Therapy (Addendum)
[place], [place], [address] Phone: [phone]   Fax:  [phone]  Physical Therapy Treatment  Patient Details  Name: Carolyn [name] MRN: [id number] Date of Birth: 1950-06-12 Referring Provider (PT): Aldine Contes, MD   Encounter Date: 12/11/2018  PT End of Session - 12/11/18 1206    Visit Number  11    Number of Visits  13    Date for PT Re-Evaluation  12/22/18    PT Start Time  3005    PT Stop Time  1140    PT Time Calculation (min)  38 min    Activity Tolerance  Patient tolerated treatment well;No increased pain    Behavior During Therapy  WFL for tasks assessed/performed       Past Medical History:  Diagnosis Date  . Arthritis    neck  . Complication of anesthesia   . Osteoporosis   . PONV (postoperative nausea and vomiting)   . Thumb pain, left     Past Surgical History:  Procedure Laterality Date  . ABDOMINAL HYSTERECTOMY    . CESAREAN SECTION    . OOPHORECTOMY    . TENDON TRANSFER Left 02/07/2016   Procedure: LEFT THUMB TENDON TRANSFER;  Surgeon: Milly Jakob, MD;  Location: [facility];  Service: Orthopedics;  Laterality: Left;  . TOTAL HIP ARTHROPLASTY Right 10/19/2018   Procedure: TOTAL HIP ARTHROPLASTY ANTERIOR APPROACH;  Surgeon: Leandrew Koyanagi, MD;  Location: [facility];  Service: Orthopedics;  Laterality: Right;    There were no vitals filed for this visit.  Subjective Assessment - 12/11/18 1108    Subjective  Pt reports she saw Dr. Erlinda Hong last week and he said he to hold off on yoga for 4-6 wks.  HEP going well.    Pertinent History  R THA on 10/19/2018, osteoporosis, arthritis, L thumb tendon transfer    Patient Stated Goals  be able to get up off floor without support    Currently in Pain?  No/denies    Pain Score  0-No pain         [facility] PT Assessment - 12/11/18 0001      Assessment   Medical Diagnosis  S72.001 closed displaced fx of R femoral  neck    Referring Provider (PT)  Aldine Contes, MD    Onset Date/Surgical Date  10/19/18    Hand Dominance  Right    Next MD Visit  01/14/19    Prior Therapy  yes, for shoulder      Strength   Right/Left Hip  Right    Right Hip Flexion  5/5    Right Hip Extension  5/5    Right Hip ABduction  5/5    Right Hip ADduction  5/5       [facility] Adult PT Treatment/Exercise - 12/11/18 0001      Self-Care   Other Self-Care Comments   reviewed self massage technique to calf with roller or hands to decrease tightness and tenderness; pt verbalized understanding.       Knee/Hip Exercises: Stretches   Passive Hamstring Stretch  Right;Left;2 reps;30 seconds   supine with strap   Quad Stretch  Right;2 reps;60 seconds;Left;1 rep;30 seconds    Gastroc Stretch  Right;Left;2 reps;20 seconds    Soleus Stretch  Right;3 reps;Left;2 reps;30 seconds    Other Knee/Hip Stretches  butterfly stretch in supine x 60 sec       Knee/Hip Exercises: Aerobic   Nustep  L5: 7  min    PTA present to discuss progress      Knee/Hip Exercises: Standing   Heel Raises  Both;15 reps    Heel Raises Limitations  5 each of toes in, out, straight.       Knee/Hip Exercises: Supine   Straight Leg Raises  Strengthening;Right;1 set;10 reps   on elbows   Straight Leg Raises Limitations  1 set of SLR in long sitting x 5 reps                PT Short Term Goals - 10/30/18 1317      PT SHORT TERM GOAL #1   Title  Access need for shoe lift for L LE due to Leg Length discrepency.    Time  3    Period  Weeks    Status  Achieved    Target Date  11/17/18        PT Long Term Goals - 12/11/18 1137      PT LONG TERM GOAL #1   Title  Independent with HEP and progression    Time  6    Status  Achieved      PT LONG TERM GOAL #2   Title  R hip ROM WFL for normal ADL's with no pain reported.    Time  6    Period  Weeks    Status  Achieved      PT LONG TERM GOAL #3   Title  Pt will improve her R hip strength to  grossly 4+/5 in order to improve gait and functional mobility.    Time  6    Period  Weeks    Status  Achieved      PT LONG TERM GOAL #4   Title  Pt will be able to amb community distances and on level/unlevel surfaces for >/ 2000 feet with no assistive device.    Baseline  per pt report - pt able to ambulate >2000 ft without difficulty.    Time  6    Period  Weeks    Status  Achieved      PT LONG TERM GOAL #5   Title  pt will improve her FOTO score from 67% limitation to </= 47% limitation.    Time  6    Period  Weeks    Status  Achieved            Plan - 12/11/18 1202    Clinical Impression Statement  Pt continues to do very well at home with HEP.  Pt complaining of some pain in Rt shin (post tib area); issued stretch for this and progressed SLR for continued strengthening to Rt ant hip.  Pt has met her goals and is pleased with level of function. Pt verbalized readiness to d/c at this time.    Comorbidities  arthritis, osteoporosis, tendon transfer in L thumb    Rehab Potential  Good    PT Frequency  2x / week    PT Duration  6 weeks    PT Treatment/Interventions  ADLs/Self Care Home Management;Cryotherapy;Electrical Stimulation;Ultrasound;Moist Heat;Therapeutic activities;Functional mobility training;Stair training;Gait training;Therapeutic exercise;Balance training;Neuromuscular re-education;Manual techniques;Patient/family education;Passive range of motion    PT Next Visit Plan  spoke to supervising PT; will d/c.    PT Home Exercise Plan  Access Code: [email or web address]    Consulted and Agree with Plan of Care  Patient       Patient will benefit from skilled therapeutic intervention in order to improve the following  deficits and impairments:  Difficulty walking, Abnormal gait, Pain, Postural dysfunction, Decreased strength, Increased edema, Decreased range of motion, Decreased activity tolerance, Decreased mobility  Visit Diagnosis: Muscle weakness (generalized)  Other  abnormalities of gait and mobility     Problem List Patient Active Problem List   Diagnosis Date Noted  . Displaced fracture of right femoral neck ([place]) 10/18/2018  . Hip fracture [facility]) 10/18/2018   Kerin Perna, PTA 12/11/18 12:10 PM  [facility] [place], [place], [address] Phone: [phone]   Fax:  [phone]  Name: Carolyn [name] MRN: [id number] Date of Birth: [birth year]  PHYSICAL THERAPY DISCHARGE SUMMARY  Visits from Start of Care: 11  Current functional level related to goals / functional outcomes: See above   Remaining deficits: See above   Education / Equipment: HEP  Plan: Patient agrees to discharge.  Patient goals were met. Patient is being discharged due to meeting the stated rehab goals.  ?????    Madelyn Flavors, PT 01/27/19 5:07 PM  [facility] [place], [place] [address]  [phone] (office) [phone] (fax)

## 2018-12-15 ENCOUNTER — Encounter: Payer: Medicare HMO | Admitting: Physical Therapy

## 2019-01-14 ENCOUNTER — Encounter: Payer: Self-pay | Admitting: Orthopaedic Surgery

## 2019-01-14 ENCOUNTER — Other Ambulatory Visit: Payer: Self-pay

## 2019-01-14 ENCOUNTER — Ambulatory Visit: Payer: Medicare HMO | Admitting: Orthopaedic Surgery

## 2019-01-14 ENCOUNTER — Ambulatory Visit (INDEPENDENT_AMBULATORY_CARE_PROVIDER_SITE_OTHER): Payer: Medicare HMO

## 2019-01-14 DIAGNOSIS — S72001A Fracture of unspecified part of neck of right femur, initial encounter for closed fracture: Secondary | ICD-10-CM | POA: Diagnosis not present

## 2019-01-14 NOTE — Progress Notes (Signed)
   Post-Op Visit Note   Patient: Carolyn [name]           Date of Birth: 07-13-1950           MRN: [id number] Visit Date: 01/14/2019 PCP: Geralynn Ochs, MD   Assessment & Plan:  Chief Complaint:  Chief Complaint  Patient presents with  . Right Hip - Follow-up, Routine Post Op   Visit Diagnoses:  1. Displaced fracture of right femoral neck ([place])     Plan: Patient is a pleasant 68 year old female who presents to our clinic today 12 weeks status post right anterior total hip replacement from a right femoral neck fracture, date of surgery 10/19/2018.  She has been doing very well.  She is back to her normal activities.  She has occasional achiness in the anterior thigh but nothing more.  This has become less frequent with time.  Examination of her right hip reveals full range of motion strength.  She is neurovascular intact distally.  At this point, she will continue to increase activity as tolerated.  Follow-up with [place] in 9 months time for repeat evaluation and AP pelvis x-rays of the right hip.  Follow-Up Instructions: Return in about 9 months (around 10/15/2019).   Orders:  Orders Placed This Encounter  Procedures  . XR HIP UNILAT W OR W/O PELVIS 2-3 VIEWS RIGHT   No orders of the defined types were placed in this encounter.   Imaging: XR HIP UNILAT W OR W/O PELVIS 2-3 VIEWS RIGHT  Result Date: 01/14/2019 Well-seated prosthesis without complication   PMFS History: Patient Active Problem List   Diagnosis Date Noted  . Displaced fracture of right femoral neck ([place]) 10/18/2018  . Hip fracture ([place]) 10/18/2018   Past Medical History:  Diagnosis Date  . Arthritis    neck  . Complication of anesthesia   . Osteoporosis   . PONV (postoperative nausea and vomiting)   . Thumb pain, left     History reviewed. No pertinent family history.  Past Surgical History:  Procedure Laterality Date  . ABDOMINAL HYSTERECTOMY    . CESAREAN SECTION    . OOPHORECTOMY    . TENDON  TRANSFER Left 02/07/2016   Procedure: LEFT THUMB TENDON TRANSFER;  Surgeon: Milly Jakob, MD;  Location: [facility];  Service: Orthopedics;  Laterality: Left;  . TOTAL HIP ARTHROPLASTY Right 10/19/2018   Procedure: TOTAL HIP ARTHROPLASTY ANTERIOR APPROACH;  Surgeon: Leandrew Koyanagi, MD;  Location: [facility];  Service: Orthopedics;  Laterality: Right;   Social History   Occupational History  . Not on file  Tobacco Use  . Smoking status: Never Smoker  . Smokeless tobacco: Never Used  Substance and Sexual Activity  . Alcohol use: Yes    Alcohol/week: 10.0 standard drinks    Types: 10 Glasses of wine per week    Comment: social  . Drug use: No  . Sexual activity: Not on file

## 2019-01-24 ENCOUNTER — Encounter: Payer: Self-pay | Admitting: Orthopaedic Surgery

## 2019-01-26 ENCOUNTER — Other Ambulatory Visit: Payer: Self-pay | Admitting: Physician Assistant

## 2019-01-26 MED ORDER — AMOXICILLIN 500 MG PO CAPS: ORAL_CAPSULE | ORAL | 0 refills | Status: AC

## 2019-01-26 NOTE — Telephone Encounter (Signed)
Sent in

## 2019-02-21 ENCOUNTER — Ambulatory Visit: Payer: Medicare HMO

## 2019-02-26 ENCOUNTER — Ambulatory Visit: Payer: Medicare HMO

## 2019-03-02 ENCOUNTER — Ambulatory Visit: Payer: Medicare HMO

## 2019-08-18 ENCOUNTER — Telehealth: Payer: Self-pay | Admitting: Orthopaedic Surgery

## 2019-08-18 MED ORDER — AMOXICILLIN 500 MG PO CAPS: 2000.0000 mg | ORAL_CAPSULE | Freq: Once | ORAL | 6 refills | Status: AC

## 2019-08-18 NOTE — Telephone Encounter (Signed)
Pt states she needs an antibiotic called in today for her dentist appt on 08/19/19 @ 9 a.m.

## 2019-10-14 ENCOUNTER — Encounter: Payer: Self-pay | Admitting: Orthopaedic Surgery

## 2019-10-14 ENCOUNTER — Ambulatory Visit: Payer: Medicare HMO | Admitting: Orthopaedic Surgery

## 2019-10-14 ENCOUNTER — Ambulatory Visit: Payer: Self-pay

## 2019-10-14 VITALS — Ht 64.0 in | Wt 128.0 lb

## 2019-10-14 DIAGNOSIS — Z96641 Presence of right artificial hip joint: Secondary | ICD-10-CM

## 2019-10-14 DIAGNOSIS — S72001A Fracture of unspecified part of neck of right femur, initial encounter for closed fracture: Secondary | ICD-10-CM | POA: Diagnosis not present

## 2019-10-14 DIAGNOSIS — R202 Paresthesia of skin: Secondary | ICD-10-CM | POA: Diagnosis not present

## 2019-10-14 MED ORDER — PREDNISONE 10 MG (21) PO TBPK: ORAL_TABLET | ORAL | 0 refills | Status: AC

## 2019-10-14 NOTE — Progress Notes (Signed)
Office Visit Note   Patient: Carolyn [name]           Date of Birth: Aug 22, 1950           MRN: [id number] Visit Date: 10/14/2019              Requested by: Geralynn Ochs, MD [address] [place],  [place] [address] PCP: Geralynn Ochs, MD   Assessment & Plan: Visit Diagnoses:  1. Displaced fracture of right femoral neck ([place])   2. Hx of total hip arthroplasty, right   3. Left leg paresthesias     Plan: Impression is 1 year status post right total hip replacement and left thigh paresthesias questionably coming from her lumbar spine.  In regards to the hip, she will continue to advance with activity as tolerated and follow-up with [place] in 1 years time for repeat evaluation and AP pelvis x-rays.  In regards to her left hip paresthesias, I will call in a steroid taper.  She will start physical therapy for her lumbar spine.  She will follow-up with [place] as needed.  Follow-Up Instructions: Return in about 1 year (around 10/13/2020).   Orders:  Orders Placed This Encounter  Procedures  . XR Pelvis 1-2 Views   No orders of the defined types were placed in this encounter.     Procedures: No procedures performed   Clinical Data: No additional findings.   Subjective: Chief Complaint  Patient presents with  . Right Hip - Follow-up    Right total hip arthroplasty 10/19/2018    HPI patient is a pleasant 69 year old female who comes today a year out right total hip replacement from a femoral neck fracture 10/19/2018.  She has been doing very well.  She did note slight pain to the right hip up until about a month ago.  This has subsided.  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  She thinks this may be looks helping to improve her right hip symptoms.  She has also been dealing with left lateral hip and anterior thigh tingling for the past 6 months.  She notes that this is slightly improved recently.  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  She also had cord flattening with mild spinal stenosis.  She is scheduled to start physical therapy for this next week.  She has not been on any steroids.  Review of Systems as detailed in HPI.  All other reviewed and are negative.   Objective: Vital Signs: Ht 5\' 4"  (1.626 m)   Wt 128 lb (58.1 kg)   BMI 21.97 kg/m   Physical Exam well-developed well-nourished female no acute distress.  Alert and oriented x3.  Ortho Exam examination of her right hip reveals a negative logroll.  Full hip flexion.  Left hip exam shows negative logroll negative straight leg raise.  She is neurovascular intact distally.  Specialty Comments:  No specialty comments available.  Imaging: XR Pelvis 1-2 Views  Result Date: 10/14/2019 Well-seated prosthesis without complication    PMFS History: Patient Active Problem List   Diagnosis Date Noted  . Displaced fracture of right femoral neck ([place]) 10/18/2018  . Hip fracture ([place]) 10/18/2018   Past Medical History:  Diagnosis Date  . Arthritis    neck  . Complication of anesthesia   . Osteoporosis   .  PONV (postoperative nausea and vomiting)   . Thumb pain, left     History reviewed. No pertinent family history.  Past Surgical History:  Procedure Laterality Date  . ABDOMINAL HYSTERECTOMY    . CESAREAN SECTION    . OOPHORECTOMY    . TENDON TRANSFER Left 02/07/2016   Procedure: LEFT THUMB TENDON TRANSFER;  Surgeon: Milly Jakob, MD;  Location: [facility];  Service: Orthopedics;  Laterality: Left;  . TOTAL HIP ARTHROPLASTY Right 10/19/2018   Procedure: TOTAL HIP ARTHROPLASTY ANTERIOR APPROACH;  Surgeon: Leandrew Koyanagi, MD;  Location: [facility];  Service: Orthopedics;  Laterality: Right;   Social History   Occupational History  . Not on file  Tobacco Use  . Smoking status: Never Smoker  .  Smokeless tobacco: Never Used  Substance and Sexual Activity  . Alcohol use: Yes    Alcohol/week: 10.0 standard drinks    Types: 10 Glasses of wine per week    Comment: social  . Drug use: No  . Sexual activity: Not on file

## 2019-10-19 ENCOUNTER — Encounter: Payer: Self-pay | Admitting: Rehabilitative and Restorative Service Providers"

## 2019-10-19 ENCOUNTER — Other Ambulatory Visit: Payer: Self-pay

## 2019-10-19 ENCOUNTER — Ambulatory Visit (INDEPENDENT_AMBULATORY_CARE_PROVIDER_SITE_OTHER): Payer: Medicare HMO | Admitting: Rehabilitative and Restorative Service Providers"

## 2019-10-19 DIAGNOSIS — R2689 Other abnormalities of gait and mobility: Secondary | ICD-10-CM | POA: Diagnosis not present

## 2019-10-19 DIAGNOSIS — R29898 Other symptoms and signs involving the musculoskeletal system: Secondary | ICD-10-CM

## 2019-10-19 DIAGNOSIS — R293 Abnormal posture: Secondary | ICD-10-CM

## 2019-10-19 DIAGNOSIS — R202 Paresthesia of skin: Secondary | ICD-10-CM | POA: Diagnosis not present

## 2019-10-19 NOTE — Patient Instructions (Signed)
Access Code: [email or web address]: [email or web address]: 09/27/2021Prepared by: Desarea Ohagan HoltExercises  Prone Press Up - 2 x daily - 7 x weekly - 1 sets - 10 reps - 2-3 sec hold  Supine Piriformis Stretch with Leg Straight - 2 x daily - 7 x weekly - 1 sets - 3 reps - 30 sec hold  Supine Piriformis Stretch with Leg Straight - 2 x daily - 7 x weekly - 1 sets - 3 reps - 30 sec hold Patient Education  Trigger Point Dry Needling

## 2019-10-21 NOTE — Therapy (Addendum)
[place], [place], [address] Phone: [phone]   Fax:  [phone]  Physical Therapy Evaluation  Patient Details  Name: Carolyn [name] MRN: [id number] Date of Birth: [birth year] Referring Provider (PT): Dr Geralynn Ochs    Encounter Date: 10/19/2019     10/19/19 1319  PT Visits / Re-Eval  Visit Number 1  Number of Visits 12  Date for PT Re-Evaluation 11/30/19  PT Time Calculation  PT Start Time 1104  PT Stop Time 1152  PT Time Calculation (min) 48 min  PT - End of Session  Activity Tolerance Patient tolerated treatment well    Past Medical History:  Diagnosis Date  . Arthritis    neck  . Complication of anesthesia   . Osteoporosis   . PONV (postoperative nausea and vomiting)   . Thumb pain, left     Past Surgical History:  Procedure Laterality Date  . ABDOMINAL HYSTERECTOMY    . CESAREAN SECTION    . OOPHORECTOMY    . TENDON TRANSFER Left 02/07/2016   Procedure: LEFT THUMB TENDON TRANSFER;  Surgeon: Milly Jakob, MD;  Location: [place];  Service: Orthopedics;  Laterality: Left;  . TOTAL HIP ARTHROPLASTY Right 10/19/2018   Procedure: TOTAL HIP ARTHROPLASTY ANTERIOR APPROACH;  Surgeon: Leandrew Koyanagi, MD;  Location: [place];  Service: Orthopedics;  Laterality: Right;    There were no vitals filed for this visit.      10/19/19 1112  Symptoms/Limitations  Subjective Patient reports intermittent pain in the Lt hip and anterior thigh for the past 6 months. she has a leg length difference since Rt THA and wears ~ 1/2 inch lift in Lt shoe most of the time which helps with tingling.  Pertinent History Rt THA ~ 1 yr ago; catarac surgery Rt  Patient Stated Goals get rid of tingling in the Lt hip and leg  Pain Assessment  Currently in Pain? Yes  Pain Location Hip  Pain Orientation Left;Posterior;Lateral  Pain Descriptors / Indicators Tingling  Pain Type Chronic pain   Pain Radiating Towards anterior and posterior thigh  Pain Onset More than a month ago  Pain Frequency Intermittent  Aggravating Factors  standing bent forward  Pain Relieving Factors tylenol; ignore it and it resolves in a few minutes to half a day            [facility] PT Assessment - 10/26/19 1142      Assessment   Medical Diagnosis Lt hip and thigh tingling     Referring Provider (PT) Dr Geralynn Ochs     Onset Date/Surgical Date 03/23/19    Hand Dominance Right    Next MD Visit PRN     Prior Therapy yes following THA       Precautions   Precautions None      Balance Screen   Has the patient fallen in the past 6 months No    Has the patient had a decrease in activity level because of a fear of falling?  No    Is the patient reluctant to leave their home because of a fear of falling?  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     Leisure household chores; garden; yoga  3x/wk; social; church; grandchildren        Observation/Other Assessments   Focus on Therapeutic Outcomes (FOTO)  23% limitation       Sensation   Additional Comments tingling Lt posterior lateral hip and anterior/posterior thigh       Posture/Postural Control   Posture Comments symmetrical boney landmarks with shoes on and lift in Lt shoe; lower Lt pelvis, hip without shoes/lift      AROM   Right/Left Hip --   WFL's bilat tight into hip extension Lt > Rt    Lumbar Flexion 90% back flat    Lumbar Extension 65%     Lumbar - Right Side Bend 75% pulling Lt side    Lumbar - Left Side Bend 75% pulling Rt side     Lumbar - Right Rotation 45%    Lumbar - Left Rotation 40%      Strength   Overall Strength Comments WFL's bilat hip flex, abd, ext       Flexibility   Hamstrings ~ 65-70 deg bilat     Quadriceps WFL's bilat     ITB slightly tight Lt    Piriformis tight Lt       Palpation   Spinal mobility hypomobile lumbar spine with CPA mobs    Palpation comment muscular tightness Lt > Rt QL/lats/lumbar paraspinals; Lt posterior hip piriformis/gluts; Lt psoas       Special Tests   Other special tests (-) SLR; slump tests                     Objective measurements completed on examination: See above findings.               10/19/19 1149  PT Education  Education Details HEP POC DN  Person(s) Educated Patient  Methods Explanation;Demonstration;Tactile cues;Verbal cues;Handout  Comprehension Verbalized understanding;Returned demonstration;Verbal cues required;Tactile cues required      10/19/19 1329  PT LONG TERM GOAL #1  Title Improve lumbar mobility segmentally  Time 6  Period Weeks  Status New  Target Date 11/30/19  PT LONG TERM GOAL #2  Title Decrease frequency, intensity and duration of tingling in the Lt LE by 50-70%  Time 6  Period Weeks  Status New  Target Date 11/30/19  PT LONG TERM GOAL #3  Title Improve core strength and stability for functional activites  Time 6  Period Weeks  Status New  Target Date 11/30/19  PT LONG TERM GOAL #4  Title Independent in HEP  Baseline -  Time 6  Period Weeks  Status New  Target Date 11/30/19  PT LONG TERM GOAL #5  Title maintain to improve FOTO >/= 23% limitation  Baseline -  Time 6  Period Weeks  Status New  Target Date 11/30/19       10/19/19 1320  Plan  Clinical Impression Statement Patient presents with ~ 6 month history of intermittent tingling in the Lt posterior hip inot the anterior and posterior thigh. She has decreased lumbar segmental mobility; muscular tightness in the Lt psoas, QL, lats, ,umbar paraspinas, piriformis, gluts. She has notable leg length difference when not corrected with lift. Patient will benefit form PT to address porblems identified.  Pt will benefit from  skilled therapeutic intervention in order to improve on the following deficits Decreased range of motion;Hypomobility;Impaired flexibility;Impaired sensation;Postural dysfunction  Stability/Clinical Decision Making Stable/Uncomplicated  Clinical Decision Making Low  Rehab Potential Good  PT Frequency 2x / week  PT Duration 6 weeks  PT Treatment/Interventions ADLs/Self Care Home Management;Aquatic Therapy;Cryotherapy;Electrical Stimulation;Iontophoresis 4mg /ml Dexamethasone;Moist Heat;Ultrasound;Functional mobility training;Therapeutic activities;Therapeutic exercise;Balance training;Neuromuscular re-education;Manual techniques;Dry needling;Taping  PT Next Visit Plan review HEP; add hip flexor stretch in sitting; DN for Lt QL/lumbar spine/posterior hip; work on segmental lumbar mobility in qudruped and possibly in sitting  PT Home Exercise Plan [phone]  Consulted and Agree with Plan of Care Patient              Patient will benefit from skilled therapeutic intervention in order to improve the following deficits and impairments:  Decreased range of motion, Hypomobility, Impaired flexibility, Impaired sensation, Postural dysfunction  Visit Diagnosis: Other symptoms and signs involving the musculoskeletal system - Plan: PT plan of care cert/re-cert  Abnormal posture - Plan: PT plan of care cert/re-cert  Paresthesias - Plan: PT plan of care cert/re-cert     Problem List Patient Active Problem List   Diagnosis Date Noted  . Displaced fracture of right femoral neck ([place]) 10/18/2018  . Hip fracture ([place]) 10/18/2018    Carolyn [name] Nilda Simmer PT, MPH  10/21/2019, 12:53 PM  [facility] [place], [place], [address] Phone: [phone]   Fax:  [phone]  Name: Carolyn [name] MRN: [id number] Date of Birth: 10-03-1950

## 2019-10-27 ENCOUNTER — Ambulatory Visit (INDEPENDENT_AMBULATORY_CARE_PROVIDER_SITE_OTHER): Payer: Medicare HMO | Admitting: Rehabilitative and Restorative Service Providers"

## 2019-10-27 ENCOUNTER — Encounter: Payer: Self-pay | Admitting: Rehabilitative and Restorative Service Providers"

## 2019-10-27 ENCOUNTER — Other Ambulatory Visit: Payer: Self-pay

## 2019-10-27 DIAGNOSIS — R293 Abnormal posture: Secondary | ICD-10-CM | POA: Diagnosis not present

## 2019-10-27 DIAGNOSIS — R202 Paresthesia of skin: Secondary | ICD-10-CM | POA: Diagnosis not present

## 2019-10-27 DIAGNOSIS — R29898 Other symptoms and signs involving the musculoskeletal system: Secondary | ICD-10-CM

## 2019-10-27 DIAGNOSIS — M6281 Muscle weakness (generalized): Secondary | ICD-10-CM | POA: Diagnosis not present

## 2019-10-27 NOTE — Patient Instructions (Addendum)
  Access Code: [email or web address]: [email or web address]: 10/05/2021Prepared by: Netta Fodge HoltExercises  Prone Press Up - 2 x daily - 7 x weekly - 1 sets - 10 reps - 2-3 sec hold  Supine Piriformis Stretch with Leg Straight - 2 x daily - 7 x weekly - 1 sets - 3 reps - 30 sec hold  Supine Piriformis Stretch with Leg Straight - 2 x daily - 7 x weekly - 1 sets - 3 reps - 30 sec hold  Cat-Camel - 2 x daily - 7 x weekly - 1 sets - 5-10 reps - 3-5 sec hold  Sit to Stand - 2 x daily - 7 x weekly - 1 sets - 10 reps - 3-5 sec hold Patient Education  Trigger Point Dry Needling

## 2019-10-27 NOTE — Therapy (Signed)
[place], [place], [address] Phone: [phone]   Fax:  [phone]  Physical Therapy Treatment  Patient Details  Name: Carolyn [name] MRN: [id number] Date of Birth: Oct 10, 1950 Referring Provider (PT): Dr Geralynn Ochs    Encounter Date: 10/27/2019   PT End of Session - 10/27/19 1020    Visit Number 2    Number of Visits 12    Date for PT Re-Evaluation 11/30/19    PT Start Time 1017    PT Stop Time 1108    PT Time Calculation (min) 51 min    Activity Tolerance Patient tolerated treatment well           Past Medical History:  Diagnosis Date   Arthritis    neck   Complication of anesthesia    Osteoporosis    PONV (postoperative nausea and vomiting)    Thumb pain, left     Past Surgical History:  Procedure Laterality Date   ABDOMINAL HYSTERECTOMY     CESAREAN SECTION     OOPHORECTOMY     TENDON TRANSFER Left 02/07/2016   Procedure: LEFT THUMB TENDON TRANSFER;  Surgeon: Milly Jakob, MD;  Location: [place];  Service: Orthopedics;  Laterality: Left;   TOTAL HIP ARTHROPLASTY Right 10/19/2018   Procedure: TOTAL HIP ARTHROPLASTY ANTERIOR APPROACH;  Surgeon: Leandrew Koyanagi, MD;  Location: [place];  Service: Orthopedics;  Laterality: Right;    There were no vitals filed for this visit.   Subjective Assessment - 10/27/19 1022    Subjective Improving - with less pain in the Lt anterior hip and thigh. The tingling is resolved for the most part. Had a little tingling after walking on the beach. Working on her exercises at home.    Currently in Pain? No/denies              [facility] PT Assessment - 10/27/19 0001      Assessment   Medical Diagnosis Lt hip and thigh tingling     Referring Provider (PT) Dr Geralynn Ochs     Onset Date/Surgical Date 03/23/19    Hand Dominance Right    Next MD Visit PRN     Prior Therapy yes following THA       Palpation    Palpation comment improving muscular tightness Lt > Rt QL/lats/lumbar paraspinals; Lt posterior hip piriformis/gluts; Lt psoas                          [facility] Adult PT Treatment/Exercise - 10/27/19 0001      Lumbar Exercises: Stretches   Hip Flexor Stretch Left;2 reps;30 seconds   supine thomas position    Hip Flexor Stretch Limitations hip flexor stretch seated 30 sec x 2 reps     Press Ups 5 reps   2-3 sec hold - poor lumbar segmental extension noted   Piriformis Stretch Left;3 reps;30 seconds   supine travell x 2 and diagonal knee to chest    Figure 4 Stretch Limitations pt doing at home       Lumbar Exercises: Seated   Sit to Stand 10 reps    Sit to Stand Limitations hinged hips - repeated x 5 with 5# KB       Lumbar Exercises: Quadruped   Madcat/Old Horse 10 reps   2-3 sec hold      Moist Heat Therapy   Number Minutes Moist Heat 10 Minutes    Moist  Heat Location Lumbar Spine;Hip      Manual Therapy   Manual therapy comments skilled palpation to assess tissue response to DN and manual work     Joint Mobilization PA mobs Lt greater trochanter     Soft tissue mobilization deep tissue work to [employer] lumbar to posterior hip     Myofascial Release posterior Lt hip/buttock             Trigger Point Dry Needling - 10/27/19 0001    Consent Given? Yes    Education Handout Provided Yes    Other Dry Needling Lt    Gluteus Maximus Response Palpable increased muscle length    Piriformis Response Palpable increased muscle length    Quadratus Lumborum Response Palpable increased muscle length                PT Education - 10/27/19 1050    Education Details DN HEP    Person(s) Educated Patient    Methods Explanation;Demonstration;Tactile cues;Verbal cues;Handout    Comprehension Verbalized understanding;Returned demonstration;Verbal cues required;Tactile cues required               PT Long Term Goals - 10/19/19 1329      PT LONG TERM GOAL #1   Title  Improve lumbar mobility segmentally    Time 6    Period Weeks    Status New    Target Date 11/30/19      PT LONG TERM GOAL #2   Title Decrease frequency, intensity and duration of tingling in the Lt LE by 50-70%    Time 6    Period Weeks    Status New    Target Date 11/30/19      PT LONG TERM GOAL #3   Title Improve core strength and stability for functional activites    Time 6    Period Weeks    Status New    Target Date 11/30/19      PT LONG TERM GOAL #4   Title Independent in HEP    Baseline -    Time 6    Period Weeks    Status New    Target Date 11/30/19      PT LONG TERM GOAL #5   Title maintain to improve FOTO >/= 23% limitation    Baseline -    Time 6    Period Weeks    Status New    Target Date 11/30/19                 Plan - 10/27/19 1103    Clinical Impression Statement Decreased tingling and no pain in the Lt anterior hip. Reviewed HEP and added exercise without difficulty. Trial of DN Lt QL and piriformis with good release of muscular tightness noted with DN/manual work. Progressing well toward goals of therapy    Rehab Potential Good    PT Frequency 2x / week    PT Duration 6 weeks    PT Treatment/Interventions ADLs/Self Care Home Management;Aquatic Therapy;Cryotherapy;Electrical Stimulation;Iontophoresis 4mg /ml Dexamethasone;Moist Heat;Ultrasound;Functional mobility training;Therapeutic activities;Therapeutic exercise;Balance training;Neuromuscular re-education;Manual techniques;Dry needling;Taping    PT Next Visit Plan review HEP; assess response to  DN for Lt QL/lumbar spine/posterior hip; work on segmental lumbar mobility in quadruped and possibly in sitting; core stabilization    PT Home Exercise Plan [id number]    Consulted and Agree with Plan of Care Patient           Patient will benefit from skilled therapeutic intervention in order to improve  the following deficits and impairments:     Visit Diagnosis: Other symptoms and signs  involving the musculoskeletal system  Abnormal posture  Paresthesias  Muscle weakness (generalized)     Problem List Patient Active Problem List   Diagnosis Date Noted   Displaced fracture of right femoral neck ([place]) 10/18/2018   Hip fracture ([place]) 10/18/2018    Caria Transue Nilda Simmer PT, MPH  10/27/2019, 11:09 AM  [facility] [place], [place], [address] Phone: [phone]   Fax:  [phone]  Name: Carolyn [name] MRN: [id number] Date of Birth: 1950-05-14

## 2019-10-29 ENCOUNTER — Ambulatory Visit (INDEPENDENT_AMBULATORY_CARE_PROVIDER_SITE_OTHER): Payer: Medicare HMO | Admitting: Rehabilitative and Restorative Service Providers"

## 2019-10-29 ENCOUNTER — Encounter: Payer: Self-pay | Admitting: Rehabilitative and Restorative Service Providers"

## 2019-10-29 ENCOUNTER — Other Ambulatory Visit: Payer: Self-pay

## 2019-10-29 DIAGNOSIS — R293 Abnormal posture: Secondary | ICD-10-CM | POA: Diagnosis not present

## 2019-10-29 DIAGNOSIS — R202 Paresthesia of skin: Secondary | ICD-10-CM | POA: Diagnosis not present

## 2019-10-29 DIAGNOSIS — M6281 Muscle weakness (generalized): Secondary | ICD-10-CM | POA: Diagnosis not present

## 2019-10-29 DIAGNOSIS — R29898 Other symptoms and signs involving the musculoskeletal system: Secondary | ICD-10-CM

## 2019-10-29 NOTE — Therapy (Signed)
[place], [place], [address] Phone: [phone]   Fax:  [phone]  Physical Therapy Treatment  Patient Details  Name: Carolyn [name] MRN: [id number] Date of Birth: 1950-03-03 Referring Provider (PT): Dr Geralynn Ochs    Encounter Date: 10/29/2019   PT End of Session - 10/29/19 1404    Visit Number 3    Number of Visits 12    Date for PT Re-Evaluation 11/30/19    PT Start Time 2694    PT Stop Time 1452    PT Time Calculation (min) 49 min    Activity Tolerance Patient tolerated treatment well           Past Medical History:  Diagnosis Date  . Arthritis    neck  . Complication of anesthesia   . Osteoporosis   . PONV (postoperative nausea and vomiting)   . Thumb pain, left     Past Surgical History:  Procedure Laterality Date  . ABDOMINAL HYSTERECTOMY    . CESAREAN SECTION    . OOPHORECTOMY    . TENDON TRANSFER Left 02/07/2016   Procedure: LEFT THUMB TENDON TRANSFER;  Surgeon: Milly Jakob, MD;  Location: [place];  Service: Orthopedics;  Laterality: Left;  . TOTAL HIP ARTHROPLASTY Right 10/19/2018   Procedure: TOTAL HIP ARTHROPLASTY ANTERIOR APPROACH;  Surgeon: Leandrew Koyanagi, MD;  Location: [place];  Service: Orthopedics;  Laterality: Right;    There were no vitals filed for this visit.   Subjective Assessment - 10/29/19 1405    Subjective Less tingling in the anterior hip - episode this am lasting about 30 min resolved with stretching and relaxing. Exercises are going well at home. No problem with DN    Currently in Pain? No/denies                             [facility] Adult PT Treatment/Exercise - 10/29/19 0001      Therapeutic Activites    Therapeutic Activities --   myofacial ball release work anterior hip/psoas in prone      Lumbar Exercises: Stretches   Hip Flexor Stretch Left;2 reps;30 seconds   supine thomas position    Hip Flexor  Stretch Limitations hip flexor stretch seated 30 sec x 2 reps     Pelvic Tilt Limitations ant post pelvic tilt sitting on dynadisc x 10 reps     Press Ups 5 reps   2-3 sec hold - poor lumbar segmental extension noted   Piriformis Stretch Left;3 reps;30 seconds   supine travell x 2 and diagonal knee to chest      Lumbar Exercises: Quadruped   Madcat/Old Horse 10 reps   2-3 sec hold      Moist Heat Therapy   Number Minutes Moist Heat 10 Minutes    Moist Heat Location Lumbar Spine;Hip      Manual Therapy   Manual therapy comments skilled palpation to assess tissue response to DN and manual work     Joint Mobilization PA mobs Lt greater trochanter     Soft tissue mobilization deep tissue work to [employer] lumbar to posterior hip     Myofascial Release posterior Lt hip/buttock             Trigger Point Dry Needling - 10/29/19 0001    Consent Given? Yes    Education Handout Provided Previously provided    Other Dry Needling Lt  Gluteus Maximus Response Palpable increased muscle length    Piriformis Response Palpable increased muscle length    Lumbar multifidi Response Palpable increased muscle length   bilat    Quadratus Lumborum Response Palpable increased muscle length                PT Education - 10/29/19 1441    Education Details HEP    Person(s) Educated Patient    Methods Explanation;Demonstration;Tactile cues;Verbal cues;Handout    Comprehension Verbalized understanding;Returned demonstration;Verbal cues required;Tactile cues required               PT Long Term Goals - 10/19/19 1329      PT LONG TERM GOAL #1   Title Improve lumbar mobility segmentally    Time 6    Period Weeks    Status New    Target Date 11/30/19      PT LONG TERM GOAL #2   Title Decrease frequency, intensity and duration of tingling in the Lt LE by 50-70%    Time 6    Period Weeks    Status New    Target Date 11/30/19      PT LONG TERM GOAL #3   Title Improve core strength and  stability for functional activites    Time 6    Period Weeks    Status New    Target Date 11/30/19      PT LONG TERM GOAL #4   Title Independent in HEP    Baseline -    Time 6    Period Weeks    Status New    Target Date 11/30/19      PT LONG TERM GOAL #5   Title maintain to improve FOTO >/= 23% limitation    Baseline -    Time 6    Period Weeks    Status New    Target Date 11/30/19                 Plan - 10/29/19 1406    Clinical Impression Statement Decreased episodes of tingling and pain with decreased duration. Good response to DN and manual work. Note increased spinal mobility with cat cow and decreased muscular tightness to palpation. Added exercises without difficulty.    Rehab Potential Good    PT Frequency 2x / week    PT Duration 6 weeks    PT Treatment/Interventions ADLs/Self Care Home Management;Aquatic Therapy;Cryotherapy;Electrical Stimulation;Iontophoresis 4mg /ml Dexamethasone;Moist Heat;Ultrasound;Functional mobility training;Therapeutic activities;Therapeutic exercise;Balance training;Neuromuscular re-education;Manual techniques;Dry needling;Taping    PT Next Visit Plan review HEP; continue DN for Lt QL/lumbar spine/posterior hip; work on segmental lumbar mobility in quadruped and possibly in sitting; core stabilization    PT Home Exercise Plan [id number]           Patient will benefit from skilled therapeutic intervention in order to improve the following deficits and impairments:     Visit Diagnosis: Other symptoms and signs involving the musculoskeletal system  Abnormal posture  Paresthesias  Muscle weakness (generalized)     Problem List Patient Active Problem List   Diagnosis Date Noted  . Displaced fracture of right femoral neck ([place]) 10/18/2018  . Hip fracture ([place]) 10/18/2018    Joshlyn Beadle Nilda Simmer PT, MPH  10/29/2019, 2:45 PM  [facility] [place] [address] [place], [place],  [address] Phone: [phone]   Fax:  [phone]  Name: Carolyn [name] MRN: [id number] Date of Birth: 11-24-1950

## 2019-10-29 NOTE — Patient Instructions (Signed)
Access Code: [email or web address]: [email or web address]: 10/07/2021Prepared by: Orlandis Sanden HoltExercises  Prone Press Up - 2 x daily - 7 x weekly - 1 sets - 10 reps - 2-3 sec hold  Supine Piriformis Stretch with Leg Straight - 2 x daily - 7 x weekly - 1 sets - 3 reps - 30 sec hold  Cat-Camel - 2 x daily - 7 x weekly - 1 sets - 5-10 reps - 3-5 sec hold  Sit to Stand - 2 x daily - 7 x weekly - 1 sets - 10 reps - 3-5 sec hold  Seated Pelvic Tilt - 2 x daily - 7 x weekly - 1 sets - 6-8 reps - 3-5 sec hold  Supine Sciatic Nerve Glide - 2 x daily - 7 x weekly - 1 sets - 8-10 reps - 1-2 sec hold

## 2019-11-02 ENCOUNTER — Ambulatory Visit (INDEPENDENT_AMBULATORY_CARE_PROVIDER_SITE_OTHER): Payer: Medicare HMO | Admitting: Rehabilitative and Restorative Service Providers"

## 2019-11-02 ENCOUNTER — Other Ambulatory Visit: Payer: Self-pay

## 2019-11-02 ENCOUNTER — Encounter: Payer: Self-pay | Admitting: Rehabilitative and Restorative Service Providers"

## 2019-11-02 DIAGNOSIS — R293 Abnormal posture: Secondary | ICD-10-CM | POA: Diagnosis not present

## 2019-11-02 DIAGNOSIS — M6281 Muscle weakness (generalized): Secondary | ICD-10-CM | POA: Diagnosis not present

## 2019-11-02 DIAGNOSIS — R2689 Other abnormalities of gait and mobility: Secondary | ICD-10-CM

## 2019-11-02 DIAGNOSIS — R29898 Other symptoms and signs involving the musculoskeletal system: Secondary | ICD-10-CM

## 2019-11-02 DIAGNOSIS — R202 Paresthesia of skin: Secondary | ICD-10-CM

## 2019-11-02 NOTE — Patient Instructions (Signed)
Access Code: [email or web address]: [email or web address]: 10/11/2021Prepared by: Hagan Maltz HoltExercises  Prone Press Up - 2 x daily - 7 x weekly - 1 sets - 10 reps - 2-3 sec hold  Supine Piriformis Stretch with Leg Straight - 2 x daily - 7 x weekly - 1 sets - 3 reps - 30 sec hold  Cat-Camel - 2 x daily - 7 x weekly - 1 sets - 5-10 reps - 3-5 sec hold  Sit to Stand - 2 x daily - 7 x weekly - 1 sets - 10 reps - 3-5 sec hold  Seated Pelvic Tilt - 2 x daily - 7 x weekly - 1 sets - 6-8 reps - 3-5 sec hold  Supine Sciatic Nerve Glide - 2 x daily - 7 x weekly - 1 sets - 8-10 reps - 1-2 sec hold  Doorway Pec Stretch at 60 Degrees Abduction - 3 x daily - 7 x weekly - 3 reps - 1 sets  Doorway Pec Stretch at 90 Degrees Abduction - 3 x daily - 7 x weekly - 3 reps - 1 sets - 30 seconds hold  Doorway Pec Stretch at 120 Degrees Abduction - 3 x daily - 7 x weekly - 3 reps - 1 sets - 30 second hold hold  Anti-Rotation Lateral Stepping with Press - 2 x daily - 7 x weekly - 1-2 sets - 10 reps - 2-3 sec hold

## 2019-11-02 NOTE — Therapy (Signed)
[place], [place], [address] Phone: [phone]   Fax:  [phone]  Physical Therapy Treatment  Patient Details  Name: Carolyn [name] MRN: [id number] Date of Birth: [birth year] Referring Provider (PT): Dr Geralynn Ochs    Encounter Date: 11/02/2019   PT End of Session - 11/02/19 1107    Visit Number 4    Number of Visits 12    Date for PT Re-Evaluation 11/30/19    PT Start Time 1105    PT Stop Time 1154    PT Time Calculation (min) 49 min           Past Medical History:  Diagnosis Date  . Arthritis    neck  . Complication of anesthesia   . Osteoporosis   . PONV (postoperative nausea and vomiting)   . Thumb pain, left     Past Surgical History:  Procedure Laterality Date  . ABDOMINAL HYSTERECTOMY    . CESAREAN SECTION    . OOPHORECTOMY    . TENDON TRANSFER Left 02/07/2016   Procedure: LEFT THUMB TENDON TRANSFER;  Surgeon: Milly Jakob, MD;  Location: [facility];  Service: Orthopedics;  Laterality: Left;  . 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   Subjective Assessment - 11/02/19 1109    Subjective Some continued tingling on an intermittent basis but the tingling is less intense. A little pain and tightness in the lower lumbar with ant/post pelvic tilt. Doing exercises at home.    Currently in Pain? No/denies                             [facility] Adult PT Treatment/Exercise - 11/02/19 0001      Lumbar Exercises: Stretches   Hip Flexor Stretch Left;2 reps;30 seconds   supine thomas position    Hip Flexor Stretch Limitations hip flexor stretch seated 30 sec x 2 reps     Pelvic Tilt Limitations ant post pelvic tilt sitting on dynadisc x 10 reps     Other  Lumbar Stretch Exercise 3 way doorway stretch 30 sec x 2 reps each position       Lumbar Exercises: Standing   Other Standing Lumbar Exercises antirotation blue TB x 10 reps each side       Lumbar Exercises: Quadruped   Madcat/Old Horse 10 reps   2-3 sec hold      Moist Heat Therapy   Number Minutes Moist Heat 10 Minutes    Moist Heat Location Lumbar Spine;Hip      Manual Therapy   Manual therapy comments skilled palpation to assess tissue response to DN and manual work     Joint Mobilization PA mobs Lt greater trochanter     Soft tissue mobilization deep tissue work to [employer] lumbar to posterior hip     Myofascial Release posterior Lt hip/buttock             Trigger Point Dry Needling - 11/02/19 0001    Consent Given? Yes    Education Handout Provided Previously provided    Printmaker Performed with Dry Needling Yes    Other Dry Needling Lt    Quadratus Lumborum Response Palpable increased muscle length  PT Education - 11/02/19 1135    Education Details HEP    Person(s) Educated Patient    Methods Explanation;Demonstration;Tactile cues;Verbal cues;Handout    Comprehension Verbalized understanding;Returned demonstration;Verbal cues required;Tactile cues required               PT Long Term Goals - 10/19/19 1329      PT LONG TERM GOAL #1   Title Improve lumbar mobility segmentally    Time 6    Period Weeks    Status New    Target Date 11/30/19      PT LONG TERM GOAL #2   Title Decrease frequency, intensity and duration of tingling in the Lt LE by 50-70%    Time 6    Period Weeks    Status New    Target Date 11/30/19      PT LONG TERM GOAL #3   Title Improve core strength and stability for functional activites    Time 6    Period Weeks    Status New    Target Date 11/30/19      PT LONG TERM GOAL #4   Title Independent in HEP    Baseline -    Time 6    Period Weeks    Status New    Target Date 11/30/19      PT LONG  TERM GOAL #5   Title maintain to improve FOTO >/= 23% limitation    Baseline -    Time 6    Period Weeks    Status New    Target Date 11/30/19                 Plan - 11/02/19 1124    Clinical Impression Statement Intensity of tingling is decreased. Patient tolerated DN with e-stim well. Added core stabilization and stretching without difficulty.    Rehab Potential Good    PT Frequency 2x / week    PT Duration 6 weeks    PT Treatment/Interventions ADLs/Self Care Home Management;Aquatic Therapy;Cryotherapy;Electrical Stimulation;Iontophoresis 4mg /ml Dexamethasone;Moist Heat;Ultrasound;Functional mobility training;Therapeutic activities;Therapeutic exercise;Balance training;Neuromuscular re-education;Manual techniques;Dry needling;Taping    PT Next Visit Plan review HEP; continue DN for Lt QL/lumbar spine/posterior hip; work on segmental lumbar mobility in quadruped and possibly in sitting; core stabilization - may add lifting    PT Home Exercise Plan [email or web address]    Consulted and Agree with Plan of Care Patient           Patient will benefit from skilled therapeutic intervention in order to improve the following deficits and impairments:     Visit Diagnosis: Other symptoms and signs involving the musculoskeletal system  Abnormal posture  Paresthesias  Muscle weakness (generalized)  Other abnormalities of gait and mobility     Problem List Patient Active Problem List   Diagnosis Date Noted  . Displaced fracture of right femoral neck ([facility]) 10/18/2018  . Hip fracture ([facility]) 10/18/2018    Carolyn [name] Carolyn [name] PT, MPH  11/02/2019, 11:51 AM  [facility] [place], [place], [address] Phone: [phone]   Fax:  [phone]  Name: Carolyn [name] MRN: [id number] Date of Birth: Jan 24, 1950

## 2019-11-04 ENCOUNTER — Ambulatory Visit (INDEPENDENT_AMBULATORY_CARE_PROVIDER_SITE_OTHER): Payer: Medicare HMO | Admitting: Rehabilitative and Restorative Service Providers"

## 2019-11-04 ENCOUNTER — Other Ambulatory Visit: Payer: Self-pay

## 2019-11-04 ENCOUNTER — Encounter: Payer: Self-pay | Admitting: Rehabilitative and Restorative Service Providers"

## 2019-11-04 DIAGNOSIS — R29898 Other symptoms and signs involving the musculoskeletal system: Secondary | ICD-10-CM

## 2019-11-04 DIAGNOSIS — R202 Paresthesia of skin: Secondary | ICD-10-CM

## 2019-11-04 DIAGNOSIS — R2689 Other abnormalities of gait and mobility: Secondary | ICD-10-CM

## 2019-11-04 DIAGNOSIS — R293 Abnormal posture: Secondary | ICD-10-CM

## 2019-11-04 DIAGNOSIS — M6281 Muscle weakness (generalized): Secondary | ICD-10-CM | POA: Diagnosis not present

## 2019-11-04 NOTE — Patient Instructions (Signed)
Access Code: [email or web address]: [email or web address]: 10/13/2021Prepared by: Vermon Grays HoltExercises  Prone Press Up - 2 x daily - 7 x weekly - 1 sets - 10 reps - 2-3 sec hold  Supine Piriformis Stretch with Leg Straight - 2 x daily - 7 x weekly - 1 sets - 3 reps - 30 sec hold  Cat-Camel - 2 x daily - 7 x weekly - 1 sets - 5-10 reps - 3-5 sec hold  Sit to Stand - 2 x daily - 7 x weekly - 1 sets - 10 reps - 3-5 sec hold  Seated Pelvic Tilt - 2 x daily - 7 x weekly - 1 sets - 6-8 reps - 3-5 sec hold  Supine Sciatic Nerve Glide - 2 x daily - 7 x weekly - 1 sets - 8-10 reps - 1-2 sec hold  Doorway Pec Stretch at 60 Degrees Abduction - 3 x daily - 7 x weekly - 3 reps - 1 sets  Doorway Pec Stretch at 90 Degrees Abduction - 3 x daily - 7 x weekly - 3 reps - 1 sets - 30 seconds hold  Doorway Pec Stretch at 120 Degrees Abduction - 3 x daily - 7 x weekly - 3 reps - 1 sets - 30 second hold hold  Anti-Rotation Lateral Stepping with Press - 2 x daily - 7 x weekly - 1-2 sets - 10 reps - 2-3 sec hold  Standing Bilateral Low Shoulder Row with Anchored Resistance - 2 x daily - 7 x weekly - 1-3 sets - 10 reps - 2-3 sec hold  Shoulder Extension with Resistance - 2 x daily - 7 x weekly - 1-3 sets - 10 reps - 2-3 sec hold  Sidelying Hip Abduction - 2 x daily - 7 x weekly - 1 sets - 10 reps - 3-5 sec hold  Glute Med Wall Lean - 2 x daily - 7 x weekly - 1-2 sets - 10 reps - 3-5 sec hold

## 2019-11-04 NOTE — Therapy (Addendum)
[place], [place], [address] Phone: [phone]   Fax:  [phone]  Physical Therapy Treatment and Discharge  Patient Details  Name: Carolyn [name] MRN: [id number] Date of Birth: 1950-02-20 Referring Provider (PT): Dr Geralynn Ochs    Encounter Date: 11/04/2019   PT End of Session - 11/04/19 1110    Visit Number 5    Number of Visits 12    Date for PT Re-Evaluation 11/30/19    PT Start Time 1106    PT Stop Time 1145    PT Time Calculation (min) 39 min    Activity Tolerance Patient tolerated treatment well           Past Medical History:  Diagnosis Date  . Arthritis    neck  . Complication of anesthesia   . Osteoporosis   . PONV (postoperative nausea and vomiting)   . Thumb pain, left     Past Surgical History:  Procedure Laterality Date  . ABDOMINAL HYSTERECTOMY    . CESAREAN SECTION    . OOPHORECTOMY    . TENDON TRANSFER Left 02/07/2016   Procedure: LEFT THUMB TENDON TRANSFER;  Surgeon: Milly Jakob, MD;  Location: [facility];  Service: Orthopedics;  Laterality: Left;  . TOTAL HIP ARTHROPLASTY Right 10/19/2018   Procedure: TOTAL HIP ARTHROPLASTY ANTERIOR APPROACH;  Surgeon: Leandrew Koyanagi, MD;  Location: [facility];  Service: Orthopedics;  Laterality: Right;    There were no vitals filed for this visit.   Subjective Assessment - 11/04/19 1111    Subjective Sleeping with pillow between knees at night. Working on exercises. Will hit some golf balls today. Goiing back to the gym next week. Pleased with progress.    Currently in Pain? No/denies              [facility] PT Assessment - 11/04/19 0001      Assessment   Medical Diagnosis Lt hip and thigh tingling     Referring Provider (PT) Dr Geralynn Ochs     Onset Date/Surgical Date 03/23/19    Hand Dominance Right    Next MD Visit PRN     Prior Therapy yes following THA       Observation/Other Assessments    Focus on Therapeutic Outcomes (FOTO)  6% limitation       Sensation   Additional Comments minimal intermittent tingling Lt anterior thigh only      AROM   Overall AROM Comments good gains in segmental lumbar mobility; ROM in hips and LB - WFLS throughout - pain free       Flexibility   Soft Tissue Assessment /Muscle Length --   WFL's bilat     Palpation   Palpation comment improving muscular tightness Lt > Rt QL/lats/lumbar paraspinals; Lt posterior hip piriformis/gluts; Lt psoas                          [facility] Adult PT Treatment/Exercise - 11/04/19 0001      Self-Care   Self-Care Lifting    Lifting 10# KB lifting from 8" working on hinged hip lift       Lumbar Exercises: Stretches   Pelvic Tilt Limitations ant post pelvic tilt sitting on dynadisc x 10 reps     Piriformis Stretch Left;Right;3 reps;30 seconds   supine travell    Other Lumbar Stretch Exercise 3 way doorway stretch 30 sec x 2 reps each position  Lumbar Exercises: Aerobic   Nustep L5 x 5 min UE at 10       Lumbar Exercises: Standing   Row Strengthening;Both;10 reps    Theraband Level (Row) Level 4 (Blue)    Row Limitations bow and arrow 10 reps each side blue TB     Shoulder Extension Strengthening;Both;10 reps;Theraband    Theraband Level (Shoulder Extension) Level 4 (Blue)    Other Standing Lumbar Exercises antirotation blue TB x 10 reps each side       Lumbar Exercises: Seated   Sit to Stand 10 reps   hinged hip 10 # KB      Lumbar Exercises: Sidelying   Hip Abduction Right;Left;10 reps;3 seconds   hips fwd; hip extended; leading with heel                 PT Education - 11/04/19 1143    Education Details HEP    Person(s) Educated Patient    Methods Explanation;Demonstration;Tactile cues;Verbal cues;Handout    Comprehension Verbalized understanding;Returned demonstration;Verbal cues required;Tactile cues required               PT Long Term Goals - 11/04/19 1144       PT LONG TERM GOAL #1   Title Improve lumbar mobility segmentally    Time 6    Period Weeks    Status Achieved      PT LONG TERM GOAL #2   Title Decrease frequency, intensity and duration of tingling in the Lt LE by 50-70%    Time 6    Period Weeks    Status Achieved      PT LONG TERM GOAL #3   Title Improve core strength and stability for functional activites    Time 6    Period Weeks    Status Achieved      PT LONG TERM GOAL #4   Title Independent in HEP    Time 6    Period Weeks                 Plan - 11/04/19 1122    Clinical Impression Statement Progressing well with good resolution of tingling in Lt anterior thigh - only slight tingling. Goals have been acomplished. Working on exercises and returning to gym next week. Patient will call with any questions or problems.    Rehab Potential Good    PT Frequency 2x / week    PT Duration 6 weeks    PT Treatment/Interventions ADLs/Self Care Home Management;Aquatic Therapy;Cryotherapy;Electrical Stimulation;Iontophoresis 78m/ml Dexamethasone;Moist Heat;Ultrasound;Functional mobility training;Therapeutic activities;Therapeutic exercise;Balance training;Neuromuscular re-education;Manual techniques;Dry needling;Taping    PT Next Visit Plan hold PT - patient will call with any questions or problems    PT Home Exercise Plan [phone]   Consulted and Agree with Plan of Care Patient           Patient will benefit from skilled therapeutic intervention in order to improve the following deficits and impairments:     Visit Diagnosis: Other symptoms and signs involving the musculoskeletal system  Abnormal posture  Paresthesias  Muscle weakness (generalized)  Other abnormalities of gait and mobility     Problem List Patient Active Problem List   Diagnosis Date Noted  . Dis[place] fracture [place] right femoral neck ([facility] 10/18/2018  . Hip fracture ([facility] 10/18/2018   PHYSICAL THERAPY DISCHARGE SUMMARY  Visits from  Start of Care: 5  Current functional level related to goals / functional outcomes: Goals met   Remaining deficits: See above  Education / Equipment: HEP Plan: Patient agrees to discharge.  Patient goals were met. Patient is being discharged due to meeting the stated rehab goals.  ?????     DONAWERTH,KAREN, PT  Vendela Troung Nilda Simmer PT, MPH  11/04/2019, 12:12 PM  [facility] [place], [place], [address] Phone: [phone]   Fax:  [phone]  Name: Caytlyn Evers MRN: [id number] Date of Birth: 1950-04-08

## 2020-10-13 ENCOUNTER — Ambulatory Visit: Payer: Medicare HMO | Admitting: Orthopaedic Surgery

## 2020-10-18 ENCOUNTER — Encounter: Payer: Self-pay | Admitting: Orthopaedic Surgery

## 2020-10-18 ENCOUNTER — Ambulatory Visit: Payer: Medicare HMO | Admitting: Orthopaedic Surgery

## 2020-10-18 ENCOUNTER — Other Ambulatory Visit: Payer: Self-pay

## 2020-10-18 ENCOUNTER — Ambulatory Visit: Payer: Self-pay

## 2020-10-18 DIAGNOSIS — M25551 Pain in right hip: Secondary | ICD-10-CM

## 2020-10-18 DIAGNOSIS — S72001A Fracture of unspecified part of neck of right femur, initial encounter for closed fracture: Secondary | ICD-10-CM | POA: Diagnosis not present

## 2020-10-18 NOTE — Progress Notes (Signed)
   Office Visit Note   Patient: Carolyn [name]           Date of Birth: 1950/03/19           MRN: [id number] Visit Date: 10/18/2020              Requested by: Geralynn Ochs, MD [place],  [place] [address] PCP: Geralynn Ochs, MD   Assessment & Plan: Visit Diagnoses:  1. Displaced fracture of right femoral neck (HCC)   2. Pain in right hip     Plan: Jamilette is now 2 years status post right total hip replacement for femoral neck fracture.  She comes in for her 2-year visit.  She is doing well has no real complaints.  She is currently doing yoga, Silver sneakers, barre class.  She has some start up stiffness at times.  Right hip scar is fully healed.  She has excellent range of motion of the hip.  X-rays demonstrate a stable total hip replacement without any complications.  She has been wearing a heel lift in her other shoe to offset the leg length discrepancy and she is doing well with this.  She is back to her normal self.  At this point we will see her back as needed.  Follow-Up Instructions: No follow-ups on file.   Orders:  Orders Placed This Encounter  Procedures   XR Pelvis 1-2 Views   No orders of the defined types were placed in this encounter.     Procedures: No procedures performed   Clinical Data: No additional findings.   Subjective: Chief Complaint  Patient presents with   Right Hip - Pain    HPI  Review of Systems   Objective: Vital Signs: There were no vitals taken for this visit.  Physical Exam  Ortho Exam  Specialty Comments:  No specialty comments available.  Imaging: XR Pelvis 1-2 Views  Result Date: 10/18/2020 Stable total hip replacement without complications    PMFS History: Patient Active Problem List   Diagnosis Date Noted   Displaced fracture of right femoral neck ([facility]) 10/18/2018   Hip fracture ([facility]) 10/18/2018   Past Medical History:  Diagnosis Date   Arthritis    neck    Complication of anesthesia    Osteoporosis    PONV (postoperative nausea and vomiting)    Thumb pain, left     History reviewed. No pertinent family history.  Past Surgical History:  Procedure Laterality Date   ABDOMINAL HYSTERECTOMY     CESAREAN SECTION     OOPHORECTOMY     TENDON TRANSFER Left 02/07/2016   Procedure: LEFT THUMB TENDON TRANSFER;  Surgeon: Milly Jakob, MD;  Location: [facility];  Service: Orthopedics;  Laterality: Left;   TOTAL HIP ARTHROPLASTY Right 10/19/2018   Procedure: TOTAL HIP ARTHROPLASTY ANTERIOR APPROACH;  Surgeon: Leandrew Koyanagi, MD;  Location: [facility];  Service: Orthopedics;  Laterality: Right;   Social History   Occupational History   Not on file  Tobacco Use   Smoking status: Never   Smokeless tobacco: Never  Substance and Sexual Activity   Alcohol use: Yes    Alcohol/week: 10.0 standard drinks    Types: 10 Glasses of wine per week    Comment: social   Drug use: No   Sexual activity: Not on file

## 2021-02-26 IMAGING — RF DG C-ARM 1-60 MIN
1 series · 2 of 2 positions shown · non-contrast
Comparison: None.
COMPARISON: None.

Addendum:
CLINICAL DATA: Hip replacement

EXAM:
DG C-ARM 1-60 MIN
FLUOROSCOPY TIME:  Fluoroscopy Time:  Not provided
Number of Acquired Spot Images: 2

[Series 1: run · 2 of 2 slices shown]
[im 1/2]
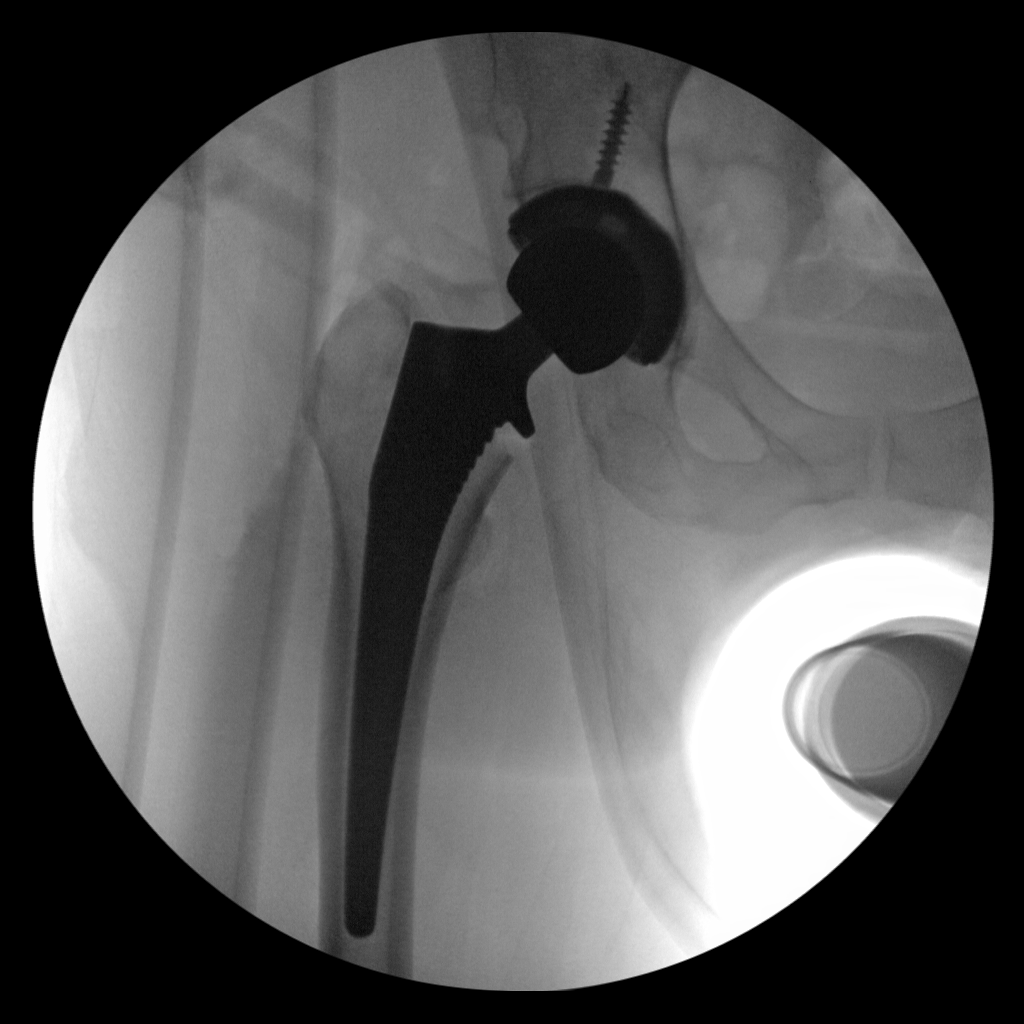
[im 2/2]
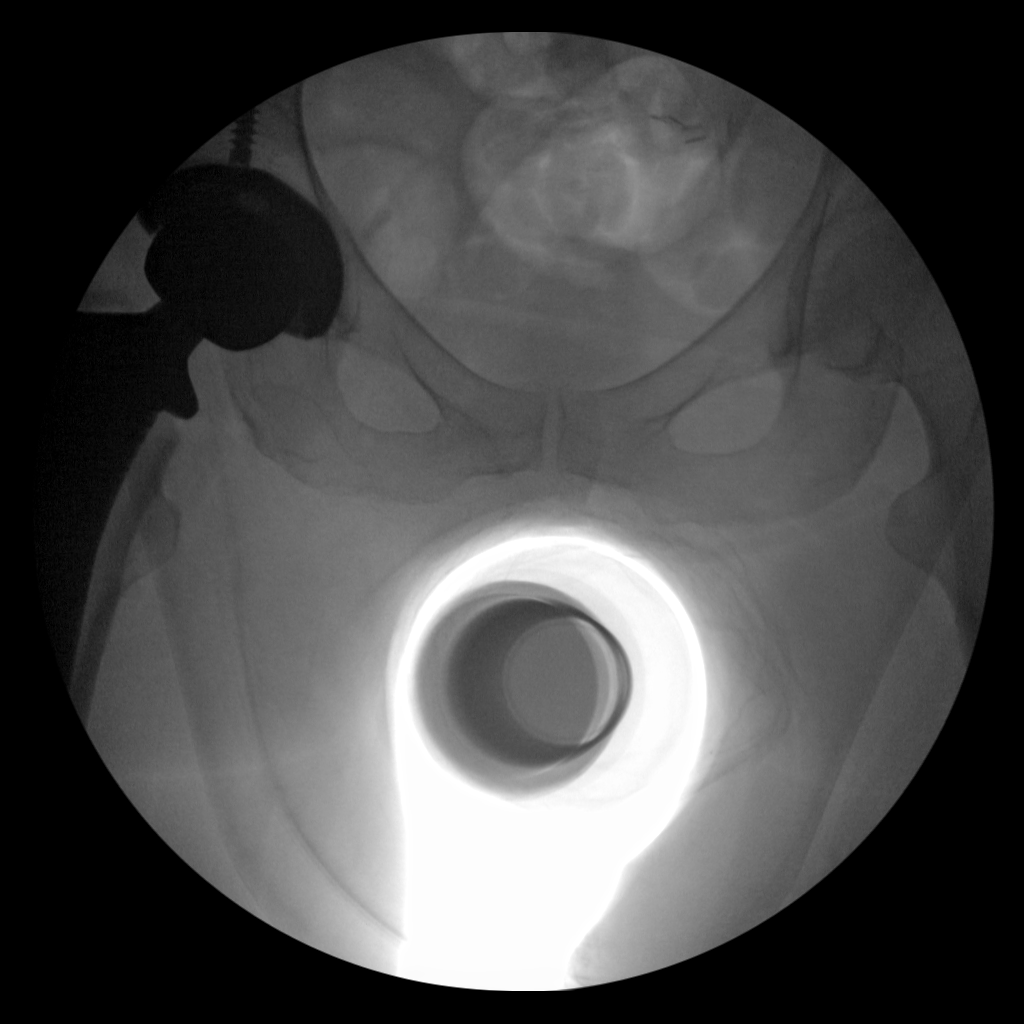

[2 of 2 positions shown; findings below may reference images not displayed]

FINDINGS: Patient is status post right hip replacement. Hardware is in good
position on the 2 frontal views.
IMPRESSION: Right hip replacement as above.

ADDENDUM:
Fluoroscopy time was 51 seconds.

*** End of Addendum ***
FINDINGS: Patient is status post right hip replacement. Hardware is in good
position on the 2 frontal views.
IMPRESSION: Right hip replacement as above.

## 2023-07-08 ENCOUNTER — Encounter (HOSPITAL_BASED_OUTPATIENT_CLINIC_OR_DEPARTMENT_OTHER): Payer: Self-pay | Admitting: Emergency Medicine

## 2023-07-08 ENCOUNTER — Emergency Department (HOSPITAL_BASED_OUTPATIENT_CLINIC_OR_DEPARTMENT_OTHER)
Admission: EM | Admit: 2023-07-08 | Discharge: 2023-07-08 | Disposition: A | Discharge status: Home / Self Care | Attending: Emergency Medicine | Admitting: Emergency Medicine

## 2023-07-08 ENCOUNTER — Other Ambulatory Visit (HOSPITAL_BASED_OUTPATIENT_CLINIC_OR_DEPARTMENT_OTHER): Payer: Self-pay

## 2023-07-08 ENCOUNTER — Other Ambulatory Visit: Payer: Self-pay

## 2023-07-08 ENCOUNTER — Emergency Department (HOSPITAL_BASED_OUTPATIENT_CLINIC_OR_DEPARTMENT_OTHER)

## 2023-07-08 DIAGNOSIS — R1032 Left lower quadrant pain: Secondary | ICD-10-CM | POA: Diagnosis present

## 2023-07-08 DIAGNOSIS — K5732 Diverticulitis of large intestine without perforation or abscess without bleeding: Secondary | ICD-10-CM | POA: Diagnosis not present

## 2023-07-08 LAB — COMPREHENSIVE METABOLIC PANEL WITH GFR
ALT: 5 U/L (ref 0–44)
AST: 17 U/L (ref 15–41)
Albumin: 4.2 g/dL (ref 3.5–5.0)
Alkaline Phosphatase: 67 U/L (ref 38–126)
Anion gap: 12 (ref 5–15)
BUN: 9 mg/dL (ref 8–23)
CO2: 25 mmol/L (ref 22–32)
Calcium: 9 mg/dL (ref 8.9–10.3)
Chloride: 99 mmol/L (ref 98–111)
Creatinine, Ser: 0.63 mg/dL (ref 0.44–1.00)
GFR, Estimated: >60 mL/min (ref >60)
Glucose, Bld: 110 mg/dL — ABNORMAL HIGH (ref 70–99)
Potassium: 4.1 mmol/L (ref 3.5–5.1)
Sodium: 136 mmol/L (ref 135–145)
Total Bilirubin: 2.3 mg/dL — ABNORMAL HIGH (ref 0.0–1.2)
Total Protein: 7.4 g/dL (ref 6.5–8.1)

## 2023-07-08 LAB — URINALYSIS, ROUTINE W REFLEX MICROSCOPIC
Bilirubin Urine: NEGATIVE
Glucose, UA: NEGATIVE mg/dL
Hgb urine dipstick: NEGATIVE
Ketones, ur: >=80 mg/dL — AB
Leukocytes,Ua: NEGATIVE
Nitrite: NEGATIVE
Protein, ur: NEGATIVE mg/dL
Specific Gravity, Urine: 1.02 (ref 1.005–1.030)
pH: 6 (ref 5.0–8.0)

## 2023-07-08 LAB — LIPASE, BLOOD: Lipase: 24 U/L (ref 11–51)

## 2023-07-08 LAB — CBC
HCT: 36.9 % (ref 36.0–46.0)
Hemoglobin: 12.5 g/dL (ref 12.0–15.0)
MCH: 32.4 pg (ref 26.0–34.0)
MCHC: 33.9 g/dL (ref 30.0–36.0)
MCV: 95.6 fL (ref 80.0–100.0)
Platelets: 322 10*3/uL (ref 150–400)
RBC: 3.86 MIL/uL — ABNORMAL LOW (ref 3.87–5.11)
RDW: 12.4 % (ref 11.5–15.5)
WBC: 10.5 10*3/uL (ref 4.0–10.5)
nRBC: 0 % (ref 0.0–0.2)

## 2023-07-08 MED ORDER — AMOXICILLIN-POT CLAVULANATE 875-125 MG PO TABS
1.0000 | ORAL_TABLET | Freq: Two times a day (BID) | ORAL | 0 refills | Status: DC
Start: 2023-07-08 — End: 2023-07-08

## 2023-07-08 MED ORDER — ONDANSETRON HCL 4 MG PO TABS
4.0000 mg | ORAL_TABLET | Freq: Four times a day (QID) | ORAL | 0 refills | Status: AC
  Filled 2023-07-08: qty 12, 3d supply, fill #0

## 2023-07-08 MED ORDER — IOHEXOL 300 MG/ML  SOLN: 100.0000 mL | Freq: Once | INTRAMUSCULAR | Status: AC | PRN

## 2023-07-08 MED ORDER — AMOXICILLIN-POT CLAVULANATE 875-125 MG PO TABS
1.0000 | ORAL_TABLET | Freq: Two times a day (BID) | ORAL | 0 refills | Status: AC
  Filled 2023-07-08: qty 14, 7d supply, fill #0

## 2023-07-08 MED ORDER — SODIUM CHLORIDE 0.9 % IV BOLUS
500.0000 mL | Freq: Once | INTRAVENOUS | Status: AC
  Administered 2023-07-08: 500 mL via INTRAVENOUS

## 2023-07-08 NOTE — ED Triage Notes (Signed)
 Pt POV c/o concern for flair of diverticulitis.  C/o LLQ abd pain that radiates to back, diarrhea x4 days.  Poor po intake x4 days.   Reports subjective fever today.

## 2023-07-08 NOTE — ED Provider Notes (Signed)
 [place] [facility] Provider Note   CSN: [id number] Arrival date & time: 07/08/23  1045     Patient presents with: Abdominal Pain   Carolyn [name] is a 73 y.o. female who presents today with concerns for left lower quadrant pain that was radiating to the back over the last 4 days.  Began Thursday night, increased on Friday night with 3 episodes of loose stools with no reported hematochezia or melena.  Since then she has begun a clear liquid diet and has not had a bowel movement since her last loose stool on Friday evening.  Her primary reason for presenting today is that while she felt better yesterday, last night she developed chills and an oral temperature of 100 F.  Of note, she has a previous medical history of diverticulosis and previous episodes of diverticulitis.  She states that current episode feels similar to previous episodes of diverticulitis.  Denies any body aches, does endorse chills and fever.  Denies nausea present but has had previous episodes of nausea.    Abdominal Pain Associated symptoms: chills, diarrhea, fatigue, fever and nausea   Associated symptoms: no constipation and no vomiting        Prior to Admission medications   Medication Sig Start Date End Date Taking? Authorizing Provider  ondansetron  (ZOFRAN ) 4 MG tablet Take 1 tablet (4 mg total) by mouth every 6 (six) hours. 07/08/23  Yes Juanetta Nordmann, PA  amoxicillin  (AMOXIL ) 500 MG capsule Take 4 tabs one hour prior to dental procedure 01/26/19   Sandie Cross, PA-C  amoxicillin -clavulanate (AUGMENTIN) 875-125 MG tablet Take 1 tablet by mouth every 12 (twelve) hours. 07/08/23   Juanetta Nordmann, PA  Calcium Carb-Cholecalciferol (CALCIUM+D3) 600-800 MG-UNIT TABS Take 1 tablet by mouth every morning.    [provider]  Cholecalciferol (VITAMIN D3) 50 MCG (2000 UT) TABS Take 1 tablet by mouth every morning.    [provider]  estrogens ,  conjugated, (PREMARIN ) 0.3 MG tablet Take 0.3 mg by mouth every morning.     [provider]  fluticasone (FLONASE) 50 MCG/ACT nasal spray Place 1 spray into both nostrils at bedtime as needed for allergies or rhinitis (seasonal allergies).    [provider]  ibuprofen  (ADVIL ) 200 MG tablet Take 3 tablets (600 mg total) by mouth every 6 (six) hours as needed for moderate pain. Patient taking differently: Take 200 mg by mouth every 6 (six) hours as needed for headache or moderate pain.  02/07/16   Rober Chimera, MD  loratadine (CLARITIN) 10 MG tablet Take 10 mg by mouth daily as needed for allergies.    [provider]  OVER THE COUNTER MEDICATION Place 1 drop into both eyes daily as needed (itching/seasonal allergies). Over the counter allergy eye drop    [provider]  predniSONE  (STERAPRED UNI-PAK 21 TAB) 10 MG (21) TBPK tablet Take as directed 10/14/19   Sandie Cross, PA-C    Allergies: Sulfa antibiotics    Review of Systems  Constitutional:  Positive for appetite change, chills, fatigue and fever.  Gastrointestinal:  Positive for abdominal pain, diarrhea and nausea. Negative for anal bleeding, blood in stool, constipation, rectal pain and vomiting.  All other systems reviewed and are negative.   Updated Vital Signs BP (!) 105/54   Pulse 78   Temp 98.3 F (36.8 C)   Resp 14   Ht 5' 4 (1.626 m)   Wt 59 kg   SpO2 99%  BMI 22.31 kg/m   Physical Exam Vitals and nursing note reviewed.  Constitutional:      General: She is not in acute distress.    Appearance: Normal appearance.  HENT:     Head: Normocephalic and atraumatic.     Mouth/Throat:     Mouth: Mucous membranes are moist.     Pharynx: Oropharynx is clear.   Eyes:     Extraocular Movements: Extraocular movements intact.     Conjunctiva/sclera: Conjunctivae normal.     Pupils: Pupils are equal, round, and reactive to light.    Cardiovascular:     Rate and Rhythm: Normal  rate and regular rhythm.     Pulses: Normal pulses.     Heart sounds: Normal heart sounds. No murmur heard.    No friction rub. No gallop.  Pulmonary:     Effort: Pulmonary effort is normal.     Breath sounds: Normal breath sounds.  Abdominal:     General: Abdomen is flat. Bowel sounds are normal. There is no distension or abdominal bruit. There are no signs of injury.     Palpations: Abdomen is soft. There is no hepatomegaly or splenomegaly.     Tenderness: There is abdominal tenderness in the right lower quadrant and left lower quadrant. There is no guarding.   Musculoskeletal:        General: Normal range of motion.     Cervical back: Normal range of motion and neck supple.     Right lower leg: No edema.     Left lower leg: No edema.   Skin:    General: Skin is warm and dry.     Capillary Refill: Capillary refill takes less than 2 seconds.   Neurological:     General: No focal deficit present.     Mental Status: She is alert. Mental status is at baseline.   Psychiatric:        Mood and Affect: Mood normal.     (all labs ordered are listed, but only abnormal results are displayed) Labs Reviewed  COMPREHENSIVE METABOLIC PANEL WITH GFR - Abnormal; Notable for the following components:      Result Value   Glucose, Bld 110 (*)    Total Bilirubin 2.3 (*)    All other components within normal limits  CBC - Abnormal; Notable for the following components:   RBC 3.86 (*)    All other components within normal limits  URINALYSIS, ROUTINE W REFLEX MICROSCOPIC - Abnormal; Notable for the following components:   Ketones, ur >=80 (*)    All other components within normal limits  LIPASE, BLOOD    EKG: None  Radiology: CT ABDOMEN PELVIS W CONTRAST Result Date: 07/08/2023 CLINICAL DATA:  Abdominal pain, left lower quadrant pain EXAM: CT ABDOMEN AND PELVIS WITH CONTRAST TECHNIQUE: Multidetector CT imaging of the abdomen and pelvis was performed using the standard protocol following  bolus administration of intravenous contrast. RADIATION DOSE REDUCTION: This exam was performed according to the departmental dose-optimization program which includes automated exposure control, adjustment of the mA and/or kV according to patient size and/or use of iterative reconstruction technique. CONTRAST:  100mL OMNIPAQUE IOHEXOL 300 MG/ML  SOLN COMPARISON:  None Available. FINDINGS: Lower chest: Linear scarring or atelectasis in the lung bases. Small hiatal hernia. No effusions. Hepatobiliary: No focal hepatic abnormality. Gallbladder unremarkable. Pancreas: No focal abnormality or ductal dilatation. Spleen: No focal abnormality.  Normal size. Adrenals/Urinary Tract: No adrenal abnormality. No focal renal abnormality. No stones or hydronephrosis. Urinary bladder  is unremarkable. Stomach/Bowel: Sigmoid diverticulosis. The floor tori stranding around the mid sigmoid colon compatible with active diverticulitis. No visible focal fluid collection. Stomach and small bowel decompressed. No bowel obstruction. Vascular/Lymphatic: No evidence of aneurysm or adenopathy. Reproductive: No visible focal abnormality. Other: No free fluid or free air. Musculoskeletal: No acute bony abnormality. IMPRESSION: Sigmoid diverticulosis with surrounding inflammatory changes in the mid sigmoid colon compatible with active diverticulitis. No visible complicating feature currently. Electronically Signed   By: Janeece Mechanic M.D.   On: 07/08/2023 15:02     Procedures   Medications Ordered in the ED  sodium chloride  0.9 % bolus 500 mL (0 mLs Intravenous Stopped 07/08/23 1535)  iohexol (OMNIPAQUE) 300 MG/ML solution 100 mL (100 mLs Intravenous Contrast Given 07/08/23 1431)                                    Medical Decision Making Amount and/or Complexity of Data Reviewed Labs: ordered. Radiology: ordered.  Risk Prescription drug management.   Medical Decision Making:   Carolyn [name] is a 73 y.o. female who presented  to the ED today with left lower abdominal pain detailed above.     Complete initial physical exam performed, notably the patient  was alert and oriented in no apparent distress.  She is tender to the left lower quadrant of the abdomen without any guarding noted.   Bowel sounds are present and normal.  Abdomen is normal-appearing and soft to palpation.  Reviewed and confirmed nursing documentation for past medical history, family history, social history.    Initial Assessment:   With the patient's presentation of left lower quadrant abdominal pain along with fatigue, most likely diagnosis is diverticulitis. Other diagnoses were considered including (but not limited to) bowel ischemia, inflammatory bowel disease, mesenteric ischemia. These are considered less likely due to history of present illness and physical exam findings.     Initial Plan:  Obtain CT of the abdomen pelvis to rule out abdominal pathology Screening labs including CBC and Metabolic panel to evaluate for infectious or metabolic etiology of disease.  Urinalysis with reflex culture ordered to evaluate for UTI or relevant urologic/nephrologic pathology.  Objective evaluation as below reviewed   Initial Study Results:   Laboratory  All laboratory results reviewed without evidence of clinically relevant pathology.   Exceptions include: Ketonuria noted of greater than 80   Radiology:  All images reviewed independently. Agree with radiology report at this time.   CT ABDOMEN PELVIS W CONTRAST Result Date: 07/08/2023 CLINICAL DATA:  Abdominal pain, left lower quadrant pain EXAM: CT ABDOMEN AND PELVIS WITH CONTRAST TECHNIQUE: Multidetector CT imaging of the abdomen and pelvis was performed using the standard protocol following bolus administration of intravenous contrast. RADIATION DOSE REDUCTION: This exam was performed according to the departmental dose-optimization program which includes automated exposure control, adjustment of the  mA and/or kV according to patient size and/or use of iterative reconstruction technique. CONTRAST:  100mL OMNIPAQUE IOHEXOL 300 MG/ML  SOLN COMPARISON:  None Available. FINDINGS: Lower chest: Linear scarring or atelectasis in the lung bases. Small hiatal hernia. No effusions. Hepatobiliary: No focal hepatic abnormality. Gallbladder unremarkable. Pancreas: No focal abnormality or ductal dilatation. Spleen: No focal abnormality.  Normal size. Adrenals/Urinary Tract: No adrenal abnormality. No focal renal abnormality. No stones or hydronephrosis. Urinary bladder is unremarkable. Stomach/Bowel: Sigmoid diverticulosis. The floor tori stranding around the mid sigmoid colon compatible with active diverticulitis. No visible  focal fluid collection. Stomach and small bowel decompressed. No bowel obstruction. Vascular/Lymphatic: No evidence of aneurysm or adenopathy. Reproductive: No visible focal abnormality. Other: No free fluid or free air. Musculoskeletal: No acute bony abnormality. IMPRESSION: Sigmoid diverticulosis with surrounding inflammatory changes in the mid sigmoid colon compatible with active diverticulitis. No visible complicating feature currently. Electronically Signed   By: Janeece Mechanic M.D.   On: 07/08/2023 15:02    Reassessment and Plan:   Extensive workup of this patient showed ketonuria of greater than 80, suspect this due to nutrition and hydration status.  Provided 500 cc of normal saline to manage.  CT imaging did demonstrate sigmoid diverticulitis.  As such we will begin outpatient therapy with oral Augmentin, also will provide prescription for oral Zofran  to manage intermittent nausea.  Further discussed use of over-the-counter pain medications to manage pain and cramping.  Further encouraged follow-up within 1 to 2 weeks for management of her progression of her diverticulitis as well as for follow-up labs to ensure clearance of ketonuria.       Final diagnoses:  Diverticulitis of large  intestine without perforation or abscess without bleeding    ED Discharge Orders          Ordered    amoxicillin -clavulanate (AUGMENTIN) 875-125 MG tablet  Every 12 hours,   Status:  Discontinued        07/08/23 1604    amoxicillin -clavulanate (AUGMENTIN) 875-125 MG tablet  Every 12 hours        07/08/23 1607    ondansetron  (ZOFRAN ) 4 MG tablet  Every 6 hours        07/08/23 1607               Juanetta Nordmann, [place] 07/08/23 1612    Roberts Ching, MD 07/09/23 [phone]

## 2023-10-15 ENCOUNTER — Other Ambulatory Visit: Payer: Self-pay | Admitting: Medical Genetics

## 2023-10-31 ENCOUNTER — Other Ambulatory Visit

## 2023-10-31 DIAGNOSIS — Z006 Encounter for examination for normal comparison and control in clinical research program: Secondary | ICD-10-CM

## 2023-11-09 LAB — GENECONNECT MOLECULAR SCREEN: Genetic Analysis Overall Interpretation: NEGATIVE
